# Patient Record
Sex: Female | Born: 1970
Health system: Southern US, Community
[De-identification: ages and names within clinical notes are randomized; demographics above are authoritative.]

## PROBLEM LIST (undated history)

## (undated) DIAGNOSIS — J45909 Unspecified asthma, uncomplicated: Secondary | ICD-10-CM

## (undated) DIAGNOSIS — I10 Essential (primary) hypertension: Secondary | ICD-10-CM

## (undated) DIAGNOSIS — C801 Malignant (primary) neoplasm, unspecified: Secondary | ICD-10-CM

## (undated) HISTORY — PX: COLONOSCOPY: SHX174

## (undated) HISTORY — PX: APPENDECTOMY: SHX54

---

## 1998-05-21 ENCOUNTER — Other Ambulatory Visit: Admission: RE | Admit: 1998-05-21 | Discharge: 1998-05-21 | Payer: Self-pay | Admitting: Obstetrics and Gynecology

## 1999-09-29 ENCOUNTER — Other Ambulatory Visit: Admission: RE | Admit: 1999-09-29 | Discharge: 1999-09-29 | Payer: Self-pay | Admitting: Obstetrics and Gynecology

## 2000-12-28 ENCOUNTER — Other Ambulatory Visit: Admission: RE | Admit: 2000-12-28 | Discharge: 2000-12-28 | Payer: Self-pay | Admitting: Obstetrics and Gynecology

## 2001-06-22 ENCOUNTER — Ambulatory Visit (HOSPITAL_COMMUNITY): Admission: RE | Admit: 2001-06-22 | Discharge: 2001-06-22 | Payer: Self-pay | Admitting: Obstetrics and Gynecology

## 2001-06-22 ENCOUNTER — Encounter: Payer: Self-pay | Admitting: Obstetrics and Gynecology

## 2001-06-28 ENCOUNTER — Encounter: Payer: Self-pay | Admitting: Obstetrics and Gynecology

## 2001-06-28 ENCOUNTER — Ambulatory Visit (HOSPITAL_COMMUNITY): Admission: RE | Admit: 2001-06-28 | Discharge: 2001-06-28 | Payer: Self-pay | Admitting: Obstetrics and Gynecology

## 2002-01-01 ENCOUNTER — Other Ambulatory Visit: Admission: RE | Admit: 2002-01-01 | Discharge: 2002-01-01 | Payer: Self-pay | Admitting: Obstetrics and Gynecology

## 2002-07-09 ENCOUNTER — Encounter: Payer: Self-pay | Admitting: Obstetrics and Gynecology

## 2002-07-09 ENCOUNTER — Encounter: Admission: RE | Admit: 2002-07-09 | Discharge: 2002-07-09 | Payer: Self-pay | Admitting: Obstetrics and Gynecology

## 2002-07-09 HISTORY — PX: BREAST CYST ASPIRATION: SHX578

## 2003-01-01 ENCOUNTER — Other Ambulatory Visit: Admission: RE | Admit: 2003-01-01 | Discharge: 2003-01-01 | Payer: Self-pay | Admitting: Obstetrics and Gynecology

## 2003-04-02 ENCOUNTER — Ambulatory Visit (HOSPITAL_BASED_OUTPATIENT_CLINIC_OR_DEPARTMENT_OTHER): Admission: RE | Admit: 2003-04-02 | Discharge: 2003-04-02 | Payer: Self-pay | Admitting: Plastic Surgery

## 2003-04-02 ENCOUNTER — Ambulatory Visit (HOSPITAL_COMMUNITY): Admission: RE | Admit: 2003-04-02 | Discharge: 2003-04-02 | Payer: Self-pay | Admitting: Plastic Surgery

## 2003-04-02 ENCOUNTER — Encounter (INDEPENDENT_AMBULATORY_CARE_PROVIDER_SITE_OTHER): Payer: Self-pay | Admitting: Specialist

## 2003-06-27 ENCOUNTER — Encounter (INDEPENDENT_AMBULATORY_CARE_PROVIDER_SITE_OTHER): Payer: Self-pay | Admitting: *Deleted

## 2003-06-27 ENCOUNTER — Inpatient Hospital Stay (HOSPITAL_COMMUNITY): Admission: AD | Admit: 2003-06-27 | Discharge: 2003-06-29 | Payer: Self-pay | Admitting: Pediatrics

## 2003-08-01 ENCOUNTER — Other Ambulatory Visit: Admission: RE | Admit: 2003-08-01 | Discharge: 2003-08-01 | Payer: Self-pay | Admitting: Obstetrics and Gynecology

## 2004-08-04 ENCOUNTER — Other Ambulatory Visit: Admission: RE | Admit: 2004-08-04 | Discharge: 2004-08-04 | Payer: Self-pay | Admitting: Obstetrics and Gynecology

## 2005-04-19 ENCOUNTER — Ambulatory Visit (HOSPITAL_COMMUNITY): Admission: RE | Admit: 2005-04-19 | Discharge: 2005-04-19 | Payer: Self-pay | Admitting: Chiropractic Medicine

## 2006-03-02 ENCOUNTER — Emergency Department (HOSPITAL_COMMUNITY): Admission: EM | Admit: 2006-03-02 | Discharge: 2006-03-03 | Payer: Self-pay | Admitting: Emergency Medicine

## 2006-03-03 ENCOUNTER — Ambulatory Visit: Payer: Self-pay | Admitting: Internal Medicine

## 2006-03-07 ENCOUNTER — Ambulatory Visit: Payer: Self-pay | Admitting: Internal Medicine

## 2006-03-07 LAB — CONVERTED CEMR LAB
AST: 81 units/L — ABNORMAL HIGH (ref 0–37)
Bilirubin, Direct: 1.1 mg/dL — ABNORMAL HIGH (ref 0.0–0.3)
IgG (Immunoglobin G), Serum: 1695 mg/dL — ABNORMAL HIGH (ref 694–1618)
Total Bilirubin: 2.3 mg/dL — ABNORMAL HIGH (ref 0.3–1.2)

## 2006-03-08 ENCOUNTER — Ambulatory Visit: Payer: Self-pay | Admitting: Internal Medicine

## 2006-03-22 ENCOUNTER — Ambulatory Visit: Payer: Self-pay | Admitting: Internal Medicine

## 2006-03-22 LAB — CONVERTED CEMR LAB
Bilirubin, Direct: 0.3 mg/dL (ref 0.0–0.3)
Total Protein: 7.4 g/dL (ref 6.0–8.3)

## 2006-03-24 ENCOUNTER — Ambulatory Visit: Payer: Self-pay | Admitting: Internal Medicine

## 2006-04-18 ENCOUNTER — Encounter: Admission: RE | Admit: 2006-04-18 | Discharge: 2006-04-18 | Payer: Self-pay | Admitting: Internal Medicine

## 2006-04-22 ENCOUNTER — Ambulatory Visit: Payer: Self-pay | Admitting: Internal Medicine

## 2006-04-22 LAB — CONVERTED CEMR LAB
ALT: 14 units/L (ref 0–40)
Alkaline Phosphatase: 74 units/L (ref 39–117)

## 2006-10-06 ENCOUNTER — Ambulatory Visit: Payer: Self-pay | Admitting: Internal Medicine

## 2006-10-06 LAB — CONVERTED CEMR LAB
ALT: 10 units/L (ref 0–40)
AST: 15 units/L (ref 0–37)
Bilirubin, Direct: 0.1 mg/dL (ref 0.0–0.3)
Total Protein: 7.4 g/dL (ref 6.0–8.3)

## 2006-10-20 ENCOUNTER — Ambulatory Visit: Payer: Self-pay | Admitting: Internal Medicine

## 2006-10-20 LAB — CONVERTED CEMR LAB: Creatinine, Ser: 0.6 mg/dL (ref 0.4–1.2)

## 2006-10-26 ENCOUNTER — Encounter: Admission: RE | Admit: 2006-10-26 | Discharge: 2006-10-26 | Payer: Self-pay | Admitting: Internal Medicine

## 2007-05-11 DIAGNOSIS — C801 Malignant (primary) neoplasm, unspecified: Secondary | ICD-10-CM

## 2007-05-11 HISTORY — PX: MELANOMA EXCISION: SHX5266

## 2007-05-11 HISTORY — DX: Malignant (primary) neoplasm, unspecified: C80.1

## 2007-09-25 ENCOUNTER — Inpatient Hospital Stay (HOSPITAL_COMMUNITY): Admission: AD | Admit: 2007-09-25 | Discharge: 2007-09-25 | Payer: Self-pay | Admitting: Obstetrics and Gynecology

## 2007-10-14 DIAGNOSIS — J309 Allergic rhinitis, unspecified: Secondary | ICD-10-CM | POA: Insufficient documentation

## 2007-10-14 DIAGNOSIS — Z862 Personal history of diseases of the blood and blood-forming organs and certain disorders involving the immune mechanism: Secondary | ICD-10-CM

## 2007-10-14 DIAGNOSIS — Z8639 Personal history of other endocrine, nutritional and metabolic disease: Secondary | ICD-10-CM

## 2007-10-20 ENCOUNTER — Inpatient Hospital Stay (HOSPITAL_COMMUNITY): Admission: AD | Admit: 2007-10-20 | Discharge: 2007-10-21 | Payer: Self-pay | Admitting: Obstetrics and Gynecology

## 2007-10-27 ENCOUNTER — Inpatient Hospital Stay (HOSPITAL_COMMUNITY): Admission: RE | Admit: 2007-10-27 | Discharge: 2007-10-29 | Payer: Self-pay | Admitting: Obstetrics and Gynecology

## 2007-12-04 ENCOUNTER — Emergency Department (HOSPITAL_COMMUNITY): Admission: EM | Admit: 2007-12-04 | Discharge: 2007-12-04 | Payer: Self-pay | Admitting: Emergency Medicine

## 2007-12-07 ENCOUNTER — Ambulatory Visit (HOSPITAL_COMMUNITY): Admission: RE | Admit: 2007-12-07 | Discharge: 2007-12-07 | Payer: Self-pay | Admitting: Urology

## 2009-06-04 IMAGING — CR DG ABDOMEN 1V
1 series · 1 of 1 positions shown · non-contrast
Comparison: CT abdomen and pelvis 12/04/2007.

CLINICAL DATA: Right UPJ stone.

ABDOMEN - 1 VIEW

[t abdomen supine]
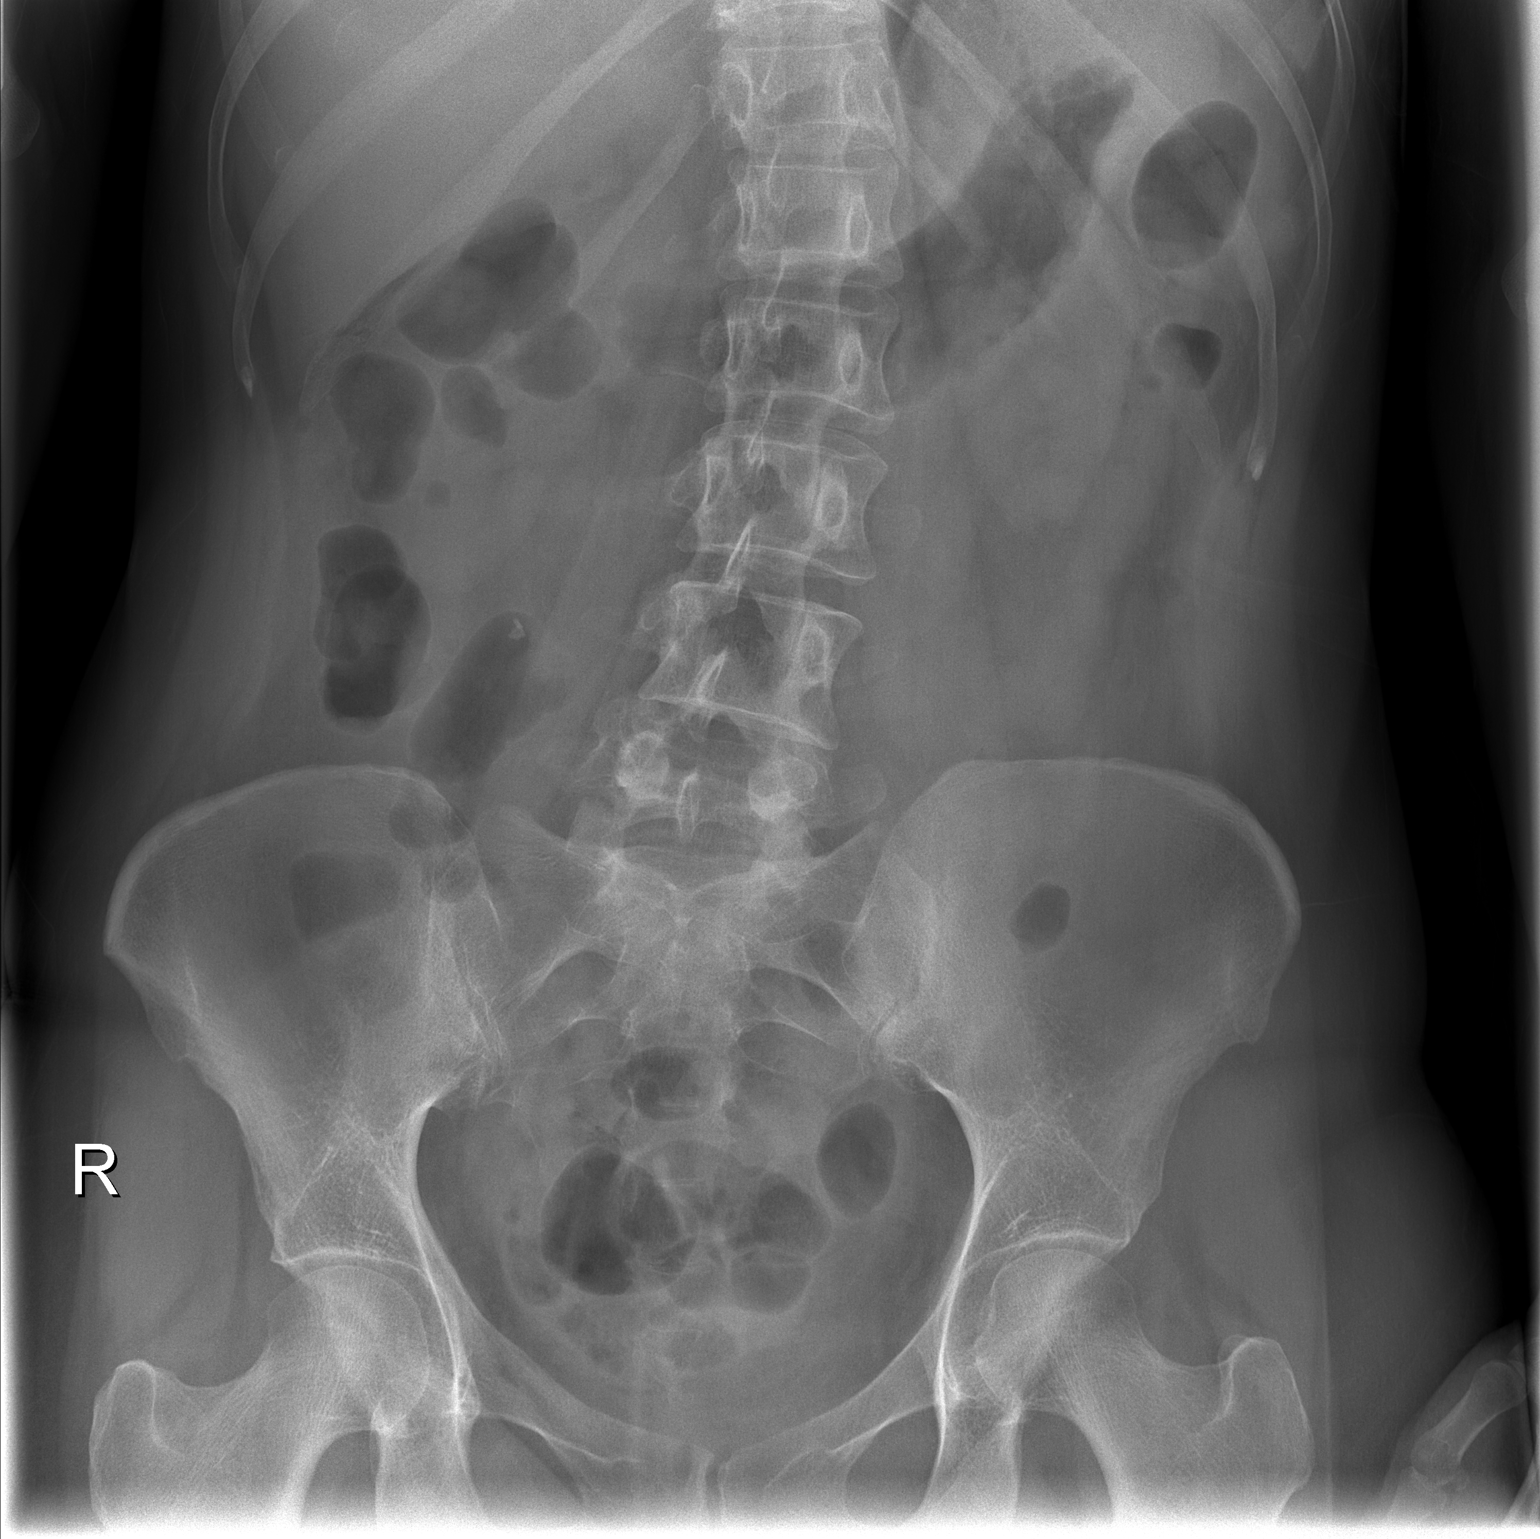

[1 of 1 positions shown; findings below may reference images not displayed]

FINDINGS: Calcification is seen projecting over expected course the
right ureter at the level of the L4 transverse process.  No other
evidence of urinary tract calculi.  Convex left scoliosis noted.
IMPRESSION: Right ureteral stone is at the level of the right L4 transverse
process.

## 2010-09-22 NOTE — Assessment & Plan Note (Signed)
[facility]                         GASTROENTEROLOGY OFFICE NOTE   NAME:[name], [name]                       MRN:          [id number]  DATE:10/06/2006                            DOB:          October 19, 1970    CHIEF COMPLAINT:  Abdominal pain, followup of hepatitis.   PROBLEMS:  1. Hepatitis syndrome with jaundice and pruritus last year, resolved.      Etiology was not clear despite extensive serologic workup.  2. Questionable prominent head of the pancreas on ultrasound and CT,      normal on MRI.  3. Two tiny (16 mm and 11 mm) rapidly enhancing lesions in the right      lobe of the liver on MRI thought to be focal nodular hyperplasia.  4. Chronic intermittent headaches.  5. Status post appendectomy.   HISTORY:  [name] comes in concerned because she has a persistent right  upper quadrant cramp that has really been there even when she was sick  before.  She is also concerned because a friend of hers developed a  problem with 30 lesions in her liver that they think is cancer and has  not been yet diagnosed.  She thinks she is still jaundiced, though her  husband does not think so.  She is on no medications at this time.  She  has not had fevers.  The pain is not really bothersome, it is just sort  of there.   PHYSICAL EXAMINATION:  GENERAL:  Well-developed, well-nourished white  woman in no acute distress.  VITAL SIGNS:  Weight 129 pounds, pulse 76, blood pressure 110/74.  ABDOMEN:  Soft.  Sometimes when I press deeply in the right upper  quadrant, there is a small area in the right mid upper quadrant that  seems tender to deep palpation.  There is no muscle tenderness.  There  is no rib tenderness.  There is no hepatosplenomegaly or mass.  There is  no stigmata of chronic liver disease.  HEENT:  The eyes are anicteric.   ASSESSMENT:  Previous hepatitis.  There is some chronic right-sided  discomfort of unclear etiology.  She has these tiny liver  lesions of  unclear etiology but are thought to represent very small areas of focal  nodular hyperplasia.  She remains concerned about the lack of diagnosis  from her previous hepatitis, i.e., no clear etiology.   PLAN:  1. Reassurance.  2. Recheck LFTs today.  Certainly if they are up, I would re-image.  3. At some point in the next 6 months, I think she would like to      consider a repeat MRI.  That is reasonable.  I think she may need      this for reassurance.  We will discuss pending the LFTs.     Iva Boop, MD,FACG  Electronically Signed    CEG/MedQ  DD: 10/06/2006  DT: 10/06/2006  Job #: [id number]

## 2010-09-25 NOTE — Assessment & Plan Note (Signed)
[facility]                           GASTROENTEROLOGY OFFICE NOTE   NAME:LIESKEXcaret, Morad                       MRN:          [id number]  DATE:03/08/2006                            DOB:          12/16/1970    ADDENDUM   I reviewed the CT and ultrasound scanning with radiologist regarding the  prominent head of pancreas. That is what she has is a prominent head of  pancreas without any mass or any signs of pancreatitis. This is probably  just a variation in her anatomy and not anything significant. However, given  the changes we may need to repeat imaging with something like an MRI (to  minimize radiation exposure) in three months. I have explained this to the  patient over the phone and we will follow up with that as planned.     Iva Boop, MD,FACG  Electronically Signed    CEG/MedQ  DD: 03/08/2006  DT: 03/08/2006  Job #: [id number]

## 2010-09-25 NOTE — Assessment & Plan Note (Signed)
[facility]                           GASTROENTEROLOGY OFFICE NOTE   NAME:[name], Roulhac                       MRN:          [id number]  DATE:03/08/2006                            DOB:          10-Dec-1970    CHIEF COMPLAINT:  Followup of jaundice, hepatitis problems.   Ms. Adelsberger returns.  Her CMV studies, acute hepatitis panel, and monospot  were all negative.  I spoke to her yesterday.  Her jaundice is improving  though she is still having itching.  Her energy level was better.  Followup  LFT show a bilirubin now 2.3, down from 6.7, alkaline phosphatase 199 down  from 246, AST stable at 81, ALT down to 169 from 323.  Her albumin is 3.3.  Total protein 7.8.  We ordered EBV titers.  We did get some immunoglobulins  back with a slightly elevated immunoglobulin G, normal IgM level.  I have  gone over numerous questions with her on the phone and then again today.  She has been on birth control in the past, but not now.  I recommended she  hold her birth control at this time and stop her Zyrtec for itching, and we  are going to try some naltrexone 50 mg as needed.   Overall, she is much improved at this point and based upon my clinical  suspicions, I think she has had some sort of a viral syndrome that caused  the hepatitis and jaundice that seems to be improving.  She understands that  I cannot tell her the exact etiology, but we have ruled out several causes  at this point.   PLAN:  1. Followup LFTs and check hepatitis A IgG level in 2 weeks with a      followup shortly there after.  2. She has a prominence of the head to the pancreas on ultrasound and CT      scanning as part of this workup.  I suspect that is not clinically      relevant.  Her amylase and lipase were normal.  I will review those      films and let her know about that.  We may need to follow that up with      imaging in the future, but it is not clear at this point.     Iva Boop, MD,FACG  Electronically Signed    CEG/MedQ  DD: 03/08/2006  DT: 03/08/2006  Job #: [id number]

## 2010-09-25 NOTE — Assessment & Plan Note (Signed)
[facility]                           GASTROENTEROLOGY OFFICE NOTE   NAME:LIESKEKamia, [name]                       MRN:          [id number]  DATE:03/24/2006                            DOB:          January 18, 1971    CHIEF COMPLAINT:  Follow up of hepatitis.   [name]'s hepatic function panel is normal with the exception of a total  bilirubin of 1.3 with a direct bilirubin of 0.3.  She feels well, she has  not had to use any more naltrexone.  The EBV studies showed evidence of old  but not new infection.  Her Hepatitis A Total Antibody was normal.  She is  feeling better except for some vague discomfort in the right flank area at  the bottom of her ribs.  Otherwise feeling well.   PHYSICAL EXAMINATION:  Weight 129 pounds, pulse 80, blood pressure 110/62.  ABDOMEN:  Soft, nontender, no organomegaly or mass.  There is perhaps some  mild tenderness in the right lateral costal margin.   Note, she said she had fever blisters before all of this started and was  asking if herpes could have caused her hepatitis.   ASSESSMENT:  1. Hepatitis, which sounds like it was viral in origin.  It could have      been herpes simplex virus, though I think people usually sicker and      have a more protracted course.  At any rate, I think her hepatitis is      resolved and probably permanently so given the workup she has had.  2. Prominence of the head of the pancreas.  Probably nothing, she is quite      concerned about this.  I looked at the CT and ultrasound with one of      the radiologists.  It was thought that is probably a normal variant but      that the head was larger than typical.  Perhaps this is related to      whatever virus I thought she had.   PLAN:  1. Supportive care.  2. She could consider getting a Hepatitis A vaccine.  3. Will plan to follow up her LFTs in a month to 6 weeks.  4. Prior to that, in early December, will go ahead with an MRI of the  pancreas to make sure there is no mass there.  Otherwise, she is      reassured.  She is claustrophobic, so will need to do an open MRI I      think.     Iva Boop, MD,FACG  Electronically Signed    CEG/MedQ  DD: 03/24/2006  DT: 03/24/2006  Job #: [id number]

## 2010-09-25 NOTE — Op Note (Signed)
NAMEJANYTH, [name]                          ACCOUNT NO.:  [id number]   MEDICAL RECORD NO.:  [id number]                   PATIENT TYPE:  MAT   LOCATION:  [facility]                                 [facility]ILITY:  [facility]   PHYSICIAN:  Brantley Persons, M.D.             DATE OF BIRTH:  10/06/1971   DATE OF PROCEDURE:  04/02/2003  DATE OF DISCHARGE:                                 OPERATIVE REPORT   PREOPERATIVE DIAGNOSIS:  1. Suspicious skin lesion, right sternal area.  2. Suspicious skin lesion, right upper breast.  3. Suspicious skin lesion, right abdomen.  4. Suspicious skin lesion, mid lower paraspinal back area.   POSTOPERATIVE DIAGNOSIS:  1. Suspicious skin lesion, right sternal area.  2. Suspicious skin lesion, right upper breast.  3. Suspicious skin lesion, right abdomen.  4. Suspicious skin lesion, mid lower paraspinal back area.   OPERATION PERFORMED:  1. Excision of a 2.0 cm right sternal suspicious recurrent skin lesion.  2. Complex closure of 2.2 cm right sternal incision.  3. Excision of 0.5 cm right breast suspicious skin lesion.  4. Intermediate closure of a 0.7 cm right breast incision.  5. Excision of 0.6 cm right abdominal suspicious skin lesion.  6. Excision of 0.7 cm suspicious mid lower back skin lesion.   SURGEON:  Brantley Persons, M.D.   ANESTHESIA:  1% lidocaine with epinephrine.   COMPLICATIONS:  None.   INDICATIONS FOR PROCEDURE:  The patient is a 40 year old Caucasian female  who was referred by Dr. Campbell Stall for evaluation for excision of several  suspicious skin lesions.  In particular, there is one on the right on the  right sternal area that has recurred.  Additionally, she has another one on  the right upper breast, the right abdomen and the mid lower paraspinal back  area.  The patient presents as she would like to have these moles removed.  The patient is currently pregnant and so I gave her Keflex perioperatively.  She has also checked with her  ob physician and he feels that it should not  be a problem to do this surgery as well as to take the Keflex.   DESCRIPTION OF PROCEDURE:  The patient was brought to the minor surgery room  and placed on the table in the supine position.  Appropriate supports were  given to her knees, her back and head, especially in consideration of her  pregnancy.  The skin and subcutaneous tissue in the area of  the right  sternal, right upper breast and right abdominal areas were then prepped with  Betadine and draped in sterile fashion.  The skin and subcutaneous tissues  were then injected with 1% lidocaine with epinephrine in each of these  areas.  The patient tolerated the procedure overall well.  Skin and  subcutaneous tissues in the areas of the right sternal, right upper breast  and right abdominal suspicious skin lesions were injected  with 1% lidocaine  with epinephrine.  After adequate hemostasis and anesthesia had taken  effect, the procedure was begun.  First the right abdominal skin lesion was  excised.  This was excised full thickness through the skin into subcutaneous  tissues.  The specimen was marked in the 12 o'clock position and passed off  the table to undergo permanent pathologic section evaluation.  The skin  edges were then undermined for easier closure.  The wound was closed using 3-  0 Monocryl in the dermal layer followed by a 3-0 Monocryl running  intracuticular stitch on the skin.  The right upper breast skin lesion was  then sharply excised.  The specimen was marked at the 12 o'clock position  with a suture and passed off the table to undergo permanent pathologic  section evaluation.  The skin edges were then undermined for easier closure.  The deeper subcutaneous tissues were closed using 3-0 Monocryl suture.  The  dermal layer was also closed with a 3-0 Monocryl suture followed by a 3-0  Monocryl running intracuticular stitch on the skin.  Next, the right sternal  skin  lesion was excised.  This also was excised full thickness through the  skin into the surrounding subcutaneous tissues.  The specimen was marked at  the 12 o'clock position with a suture and then passed off the table to  undergo permanent pathologic section evaluation.  I did have the patient sit  up during this part of it so that we could get her skin tension lines  properly across her upper breast.  She was then placed back down into the  recumbent position.  The skin edges were undermined for easier closure.  The  deep subcutaneous tissues were closed using 3-0 Monocryl suture.  The dermal  layer was also closed with a 3-0 Monocryl suture.  The skin was also closed  with a 3-0 Prolene running intracuticular suture.  All three of these areas  of closed incisions were then dressed with Steri-Strips.  The patient was  then placed on her side with her right hip up.  The area of her skin that  had the skin lesion in the mid lower back area was then prepped with  Betadine and draped in sterile fashion.  The skin and subcutaneous tissues  in the area of this skin were injected with 1% lidocaine with epinephrine.  After adequate hemostasis and anesthesia had taken effect, the procedure was  begun.  Using loupe magnification, the borders of the skin lesion were  examined.  At least 1 mm margins were marked circumferentially around the  skin lesion at that time.  The suspicious skin lesion was excised full  thickness through the skin into the subcutaneous fatty tissue layers.  The  specimen was marked at the 12 o'clock position with a suture and passed off  the table to undergo permanent pathologic section evaluation.  The incision  was then closed using 3-0 Monocryl in the deep as well as superficial dermal  layers and a 3-0 Monocryl running intracuticular stitch.  This incision was dressed with benzoin and Steri-Strips.  There were no complications.  The  patient tolerated the procedure well. At  the end of the case, she was  ambulated and discharged home in stable condition.  Follow-up appointment  will be tomorrow in the office.  Brantley Persons, M.D.    MC/MEDQ  D:  04/02/2003  T:  04/03/2003  Job:  [id number]

## 2010-09-25 NOTE — Assessment & Plan Note (Signed)
[facility]                           GASTROENTEROLOGY OFFICE NOTE   [name], [name]                       MRN:          [id number]  DATE:03/03/2006                            DOB:          May 13, 1970    REFERRING PHYSICIAN:  Trudi Ida. Denton Lank, M.D.   REASON FOR CONSULTATION:  Jaundice, hepatitis.   ASSESSMENT:  A 40 year old white woman with a several day history of  jaundice and a viral syndrome.  This is suggestive of some sort of viral  hepatitis. Her Monospot is negative.  It is not really consistent with  hepatitis A or B, thought those are possible.  Testing for those is pending.   PLAN:  1. Await acute hepatitis panel, CMV serologies, ANA titer, those pending.  2. Supportive care at this point.  She has mild pruritus and otherwise      mild to moderate symptoms and is tolerating life as an outpatient.  Her      INR is normal.  3. Once results are in, we will notify the patient.  4. We will need repeat lab testing timing to be determined upon clinical      course.   HISTORY:  A 40 year old white woman that was in her usual state of health  without problems.  Saturday night she developed some left sided sinus  congestion relatively severe and sudden, felt achy.  That was six days ago.  The next day she had rash, problems with a macular rash over her body, low  grade fever, noticed orange-colored urine the following day, aches,  headache, low-grade fever as well.  Sort of a stuttering pattern where she  felt a little better at one point but then yesterday felt even better but  then today feels a little bit worse.  She has been to [facility] and the  emergency room.  On March 01, 2006, bilirubin 5.1, direct 3.92, alk phos 238, AST 189, ALT  496.  [place], [facility] Spotted Fever titers are negative.  She went to  the ER because of this and her bilirubin was 6.7 yesterday, alk phos 246,  AST 81, ALT 323.  CBC was normal.  She did have  a left shift.  No atypical  lymphocytes noted.  They called me and I recommended a Monospot, that was  negative.  Urinalysis shows bile or bilirubin, 3-6 white cells.  An  ultrasound was performed that showed a prominent pancreatic head and a 7-to-  8-mm common bile duct with no gallstones or gallbladder abnormality.  A CT  was performed which showed similar findings with a prominent pancreatic head  but no biliary ductal dilation or other abnormalities.  Her lipase was  normal.  Amylase normal as well.  At this point she feels somewhat tired and sore in her abdomen.  Her rash is  fading.  She had a rash on her trunk and her wrists and her lower  extremities.  It is now only on the lower extremities.  She does not think  she is still febrile.   MEDICATIONS:  Zyrtec, Goody Powder as needed.  DRUG ALLERGIES:  NONE KNOWN.   Note, she has not been on antibiotics recently.   FAMILY HISTORY:  No liver disease reported, though her mother has had  gallstones and has had post cholecystectomy pain; she also had gallstone  pancreatitis.   PAST MEDICAL HISTORY:  1. Chronic intermittent headaches, usually related to menstrual cycle.  2. Allergic rhinosinusitis.  3. Appendectomy.   SOCIAL HISTORY:  She is married.  She has one child.  She works in [employer].  She has a glass of wine once a month at most.  No tobacco or  drugs.   REVIEW OF SYSTEMS:  See above.  Note, she has had muscle aches and joint  pain with this.   PHYSICAL EXAMINATION:  GENERAL:  Reveals a well-developed, well-nourished,  young to middle-aged white woman.  VITAL SIGNS:  Weight 127.8 pounds, pulse 84, blood pressure 100/72.  EYES:  Show sclerae icterus.  ORAL CAVITY:  Mouth free of lesions.  The palate is icteric.  NECK:  Supple.  No thyromegaly or mass.  CHEST:  Clear.  HEART:  S1 S2.  No rubs or gallops.  ABDOMEN:  Soft, nontender.  No organomegaly, mass.  LYMPHATIC:  No neck or supraclavicular nodes.   SKIN:  Jaundiced.  There is a macular, slightly raised rash with 2-to-3-mm  lesions in the pretibial area on the lower extremities.  It is red.  No real  rash otherwise.  No stigmata of chronic liver disease.  NEUROLOGIC/PSYCHIATRIC:  She is alert and oriented x3.   I have reviewed the ER notes, the CT and ultrasound reports, laboratory  studies.  Her potassium was slightly low at 3.2.  This has not been  supplemented yet.  We can follow up on that.     Iva Boop, MD,FACG    CEG/MedQ  DD: 03/03/2006  DT: 03/04/2006  Job #: [id number]   cc:   Trudi Ida. Denton Lank, M.D.

## 2010-09-25 NOTE — H&P (Signed)
[name], [name]                          ACCOUNT NO.:  [id number]   MEDICAL RECORD NO.:  [id number]                   PATIENT TYPE:  INP   LOCATION:  9169                                 [facility]ACILITY:  [facility]   PHYSICIAN:  Guy Sandifer. Arleta Creek, M.D.           DATE OF BIRTH:  10/06/1971   DATE OF ADMISSION:  06/27/2003  DATE OF DISCHARGE:                                HISTORY & PHYSICAL   CHIEF COMPLAINT:  Ruptured membranes.   HISTORY OF PRESENT ILLNESS:  This patient is a 40 year old married white  female Gravida II, Para 0 with an EDC of July 22, 2003 confirmed by  ultrasound placing her at 75 and 3/7 weeks.  She began leaking fluid enough  to make her underwear wet at approximately 6 a.m. this morning.  She has  leaked intermittently throughout the day.  While being examined in the  office she had fluid leaking onto the floor.  She has had a few  contractions.  She has had some spotting over the last couple of days after  an examination on February 15, but no heavy bleeding.  No central nervous  system changes.  No epigastric pain.  No fever.  No nausea or vomiting.   PAST MEDICAL HISTORY:  Per prenatal history and physical.   PAST SURGICAL HISTORY:  Per prenatal history and physical.   FAMILY HISTORY:  Per prenatal history and physical.   OBSTETRIC HISTORY:  Per prenatal history and physical.   MEDICATIONS:  1. Prenatal vitamins.   ALLERGIES:  No known drug allergies.   SOCIAL HISTORY:  The patient denies tobacco, alcohol or drug abuse.   PHYSICAL EXAMINATION:  VITAL SIGNS:  Height 5'8, weight 162 pounds, blood  pressure 132/90.  HEENT:  Without periorbital edema.  LUNGS:  Clear to auscultation.  HEART:  Regular rate and rhythm.  BREASTS:  Not examined.  ABDOMEN:  Gravid and non-tender.  No epigastric tenderness.  EXTREMITIES:  1+ edema.  Deep tendon reflexes are 2+ and equal without  clonus.  PELVIC EXAM:  Gross rupture of membranes spilling onto the floor,  nitrazine  positive.  Cervix is 2 cm dilated, 70% effaced and -2 station and vertex.   LABORATORY DATA:  Group B Beta Strep unknown.  Blood type A positive. Rh  antibody screen negative.  RPR non-reactive.  Rubella immune.  Hepatitis B  surface antigen negative. HIV declined.  Gonorrhea and chlamydia negative.   ASSESSMENT:  1. Intrauterine pregnancy at 36 and 3/7 weeks.  2. Gross rupture of membranes.  3. Unknown Group B Beta Strep status.   PLAN:  1. Admit to labor and delivery.  2. We will repeat blood pressures and obtain PH laboratories.  3. We will do Group B Beta Strep culture.  Meanwhile we will treat on Group     B Beta Strep protocol.  Guy Sandifer Arleta Creek, M.D.    JET/MEDQ  D:  06/27/2003  T:  06/27/2003  Job:  [id number]

## 2010-09-25 NOTE — Assessment & Plan Note (Signed)
[facility]                           GASTROENTEROLOGY OFFICE NOTE   ROMI, RATHEL                       MRN:          [id number]  DATE:03/12/2006                            DOB:          05-11-1970    REASON:  Ms. Helvey called concerned about some right upper quadrant pain.  She has minimal crampy discomfort just below the ribs on the right side.  She has apparently been diagnosed with acute hepatitis.  She claims that her  liver tests have been improving.  She is without fever or nausea and  vomiting.   I explained to Ms. Cortez that the symptoms are not in of itself alarming.  This could be due to some mild capsular distention of the liver.  No further  action was taken.  She was instructed to contact the office if she develops  fever, chills or worsening abdominal pain.     Barbette Hair. Arlyce Dice, MD,FACG  Electronically Signed    RDK/MedQ  DD: 03/12/2006  DT: 03/13/2006  Job #: [id number]   cc:   Iva Boop, MD,FACG

## 2011-02-04 LAB — CCBB MATERNAL DONOR DRAW

## 2011-02-04 LAB — URINALYSIS, ROUTINE W REFLEX MICROSCOPIC
Bilirubin Urine: NEGATIVE
Glucose, UA: NEGATIVE
Ketones, ur: NEGATIVE
pH: 6

## 2011-02-04 LAB — URIC ACID: Uric Acid, Serum: 4.3

## 2011-02-04 LAB — CBC
HCT: 43.1
Hemoglobin: 13.7
Hemoglobin: 13.2
MCHC: 33
Platelets: 156
Platelets: 186
RBC: 4.51
RDW: 14.6
RDW: 15
RDW: 15.1
WBC: 13.5 — ABNORMAL HIGH

## 2011-02-04 LAB — COMPREHENSIVE METABOLIC PANEL
Alkaline Phosphatase: 132 — ABNORMAL HIGH
BUN: 7
CO2: 23
GFR calc non Af Amer: >60
Glucose, Bld: 95
Potassium: 3.6
Total Bilirubin: 0.6
Total Protein: 6.2

## 2011-02-04 LAB — LACTATE DEHYDROGENASE: LDH: 141

## 2011-02-04 LAB — RPR: RPR Ser Ql: NONREACTIVE

## 2011-02-05 LAB — BASIC METABOLIC PANEL
BUN: 11
Chloride: 106
GFR calc non Af Amer: >60
Glucose, Bld: 87
Potassium: 3.9
Sodium: 142

## 2011-02-05 LAB — CBC
HCT: 41.4
Hemoglobin: 14
MCV: 95.5
Platelets: 240
WBC: 6.1

## 2011-02-05 LAB — URINALYSIS, ROUTINE W REFLEX MICROSCOPIC
Ketones, ur: NEGATIVE
Protein, ur: NEGATIVE
Urobilinogen, UA: 0.2

## 2011-02-05 LAB — URINE MICROSCOPIC-ADD ON

## 2011-02-05 LAB — POCT PREGNANCY, URINE: Preg Test, Ur: NEGATIVE

## 2014-05-10 HISTORY — PX: OTHER SURGICAL HISTORY: SHX169

## 2014-06-13 ENCOUNTER — Other Ambulatory Visit: Payer: Self-pay | Admitting: Obstetrics and Gynecology

## 2015-09-21 ENCOUNTER — Encounter (HOSPITAL_BASED_OUTPATIENT_CLINIC_OR_DEPARTMENT_OTHER): Payer: Self-pay | Admitting: *Deleted

## 2015-09-21 ENCOUNTER — Emergency Department (HOSPITAL_BASED_OUTPATIENT_CLINIC_OR_DEPARTMENT_OTHER)
Admission: EM | Admit: 2015-09-21 | Discharge: 2015-09-21 | Disposition: A | Discharge status: Home / Self Care | Payer: BLUE CROSS/BLUE SHIELD | Attending: Emergency Medicine | Admitting: Emergency Medicine

## 2015-09-21 ENCOUNTER — Emergency Department (HOSPITAL_BASED_OUTPATIENT_CLINIC_OR_DEPARTMENT_OTHER): Payer: BLUE CROSS/BLUE SHIELD

## 2015-09-21 DIAGNOSIS — Z0131 Encounter for examination of blood pressure with abnormal findings: Secondary | ICD-10-CM | POA: Diagnosis not present

## 2015-09-21 DIAGNOSIS — R0600 Dyspnea, unspecified: Secondary | ICD-10-CM

## 2015-09-21 DIAGNOSIS — J45909 Unspecified asthma, uncomplicated: Secondary | ICD-10-CM | POA: Insufficient documentation

## 2015-09-21 DIAGNOSIS — Z859 Personal history of malignant neoplasm, unspecified: Secondary | ICD-10-CM | POA: Insufficient documentation

## 2015-09-21 DIAGNOSIS — Z79899 Other long term (current) drug therapy: Secondary | ICD-10-CM | POA: Insufficient documentation

## 2015-09-21 DIAGNOSIS — I10 Essential (primary) hypertension: Secondary | ICD-10-CM | POA: Insufficient documentation

## 2015-09-21 DIAGNOSIS — Z0130 Encounter for examination of blood pressure without abnormal findings: Secondary | ICD-10-CM

## 2015-09-21 DIAGNOSIS — R0602 Shortness of breath: Secondary | ICD-10-CM | POA: Insufficient documentation

## 2015-09-21 HISTORY — DX: Essential (primary) hypertension: I10

## 2015-09-21 HISTORY — DX: Unspecified asthma, uncomplicated: J45.909

## 2015-09-21 HISTORY — DX: Malignant (primary) neoplasm, unspecified: C80.1

## 2015-09-21 LAB — CBC WITH DIFFERENTIAL/PLATELET
BASOS PCT: 0 %
Basophils Absolute: 0 10*3/uL (ref 0.0–0.1)
EOS ABS: 0 10*3/uL (ref 0.0–0.7)
Eosinophils Relative: 1 %
HEMATOCRIT: 41.4 % (ref 36.0–46.0)
HEMOGLOBIN: 13.9 g/dL (ref 12.0–15.0)
LYMPHS ABS: 1 10*3/uL (ref 0.7–4.0)
Lymphocytes Relative: 18 %
MCH: 32 pg (ref 26.0–34.0)
MCHC: 33.6 g/dL (ref 30.0–36.0)
MCV: 95.4 fL (ref 78.0–100.0)
MONO ABS: 0.3 10*3/uL (ref 0.1–1.0)
MONOS PCT: 6 %
NEUTROS PCT: 75 %
Neutro Abs: 4.5 10*3/uL (ref 1.7–7.7)
Platelets: 261 10*3/uL (ref 150–400)
RBC: 4.34 MIL/uL (ref 3.87–5.11)
RDW: 12.4 % (ref 11.5–15.5)
WBC: 5.9 10*3/uL (ref 4.0–10.5)

## 2015-09-21 LAB — BASIC METABOLIC PANEL
Anion gap: 7 (ref 5–15)
BUN: 10 mg/dL (ref 6–20)
CALCIUM: 9.6 mg/dL (ref 8.9–10.3)
CHLORIDE: 105 mmol/L (ref 101–111)
CO2: 28 mmol/L (ref 22–32)
CREATININE: 0.65 mg/dL (ref 0.44–1.00)
GFR calc Af Amer: >60 mL/min (ref >60)
GLUCOSE: 91 mg/dL (ref 65–99)
Potassium: 4 mmol/L (ref 3.5–5.1)
SODIUM: 140 mmol/L (ref 135–145)

## 2015-09-21 LAB — URINALYSIS, ROUTINE W REFLEX MICROSCOPIC
BILIRUBIN URINE: NEGATIVE
Glucose, UA: NEGATIVE mg/dL
Hgb urine dipstick: NEGATIVE
KETONES UR: NEGATIVE mg/dL
LEUKOCYTES UA: NEGATIVE
NITRITE: NEGATIVE
Protein, ur: NEGATIVE mg/dL
Specific Gravity, Urine: 1.011 (ref 1.005–1.030)
pH: 6.5 (ref 5.0–8.0)

## 2015-09-21 LAB — TROPONIN I

## 2015-09-21 LAB — PREGNANCY, URINE: Preg Test, Ur: NEGATIVE

## 2015-09-21 NOTE — ED Provider Notes (Signed)
CSN: [id number]     Arrival date & time 09/21/15  1026 History   First MD Initiated Contact with Patient 09/21/15 1041     Chief Complaint  Patient presents with  . Hypertension  . Shortness of Breath     (Consider location/radiation/quality/duration/timing/severity/associated sxs/prior Treatment) HPI Comments: Patient with multiple complaints. States she's not been feeling well for the past week and feeling fatigued. Has had a lot of stress at work. She did measure her blood pressure has been elevated 140 over 90s on a wrist cuff. States she was told she might be blood pressure medication several years ago but did not start it. She endorses feeling anxious and some shortness of breath and chest tightness. She also feels lightheaded and dizzy at times. She had a headache that lasted 2 days 2 days ago. It was gradual onset and diffuse associated with photophobia. No nausea or vomiting. No fever. No headache currently. No weakness, numbness or tingling. No bowel or bladder incontinence. No chronic medical conditions or medications. She does not have regular PCP. She was told she might need blood pressure medication but wanted to try non medical therapy first. She denies feeling dizzy or short of breath currently. She is not in any distress.  Patient is a 45 y.o. female presenting with shortness of breath. The history is provided by the patient and a relative.  Shortness of Breath Associated symptoms: headaches   Associated symptoms: no abdominal pain, no chest pain, no fever and no vomiting     Past Medical History  Diagnosis Date  . Hypertension   . Asthma   . Cancer [facility]) 2009    melanoma   Past Surgical History  Procedure Laterality Date  . Melanoma excision  2009  . Ablation  2016    uterine  . Appendectomy     No family history on file. Social History  Substance Use Topics  . Smoking status: Never Smoker   . Smokeless tobacco: Never Used  . Alcohol Use: None   OB History     No data available     Review of Systems  Constitutional: Negative for fever, chills, activity change and appetite change.  HENT: Negative for congestion and rhinorrhea.   Eyes: Positive for photophobia.  Respiratory: Positive for chest tightness and shortness of breath.   Cardiovascular: Negative for chest pain and palpitations.  Gastrointestinal: Negative for nausea, vomiting and abdominal pain.  Genitourinary: Negative for dysuria, hematuria, vaginal bleeding and vaginal discharge.  Musculoskeletal: Negative for myalgias and arthralgias.  Skin: Negative for wound.  Neurological: Positive for dizziness, weakness, light-headedness and headaches. Negative for numbness.  A complete 10 system review of systems was obtained and all systems are negative except as noted in the HPI and PMH.      Allergies  Zithromax  Home Medications   Prior to Admission medications   Medication Sig Start Date End Date Taking? Authorizing Provider  cetirizine (ZYRTEC) 5 MG tablet Take 5 mg by mouth daily as needed for allergies.   Yes Historical Provider, MD   BP 158/97 mmHg  Pulse 90  Temp(Src) 97.8 F (36.6 C) (Oral)  Resp 20  Ht 5' 7.5" (1.715 m)  Wt 140 lb (63.504 kg)  BMI 21.59 kg/m2  SpO2 100% Physical Exam  Constitutional: She is oriented to person, place, and time. She appears well-developed and well-nourished. No distress.  Slightly anxious  HENT:  Head: Normocephalic and atraumatic.  Mouth/Throat: Oropharynx is clear and moist. No oropharyngeal exudate.  Eyes: Conjunctivae and EOM are normal. Pupils are equal, round, and reactive to light.  Neck: Normal range of motion. Neck supple.  No meningismus.  Cardiovascular: Normal rate, regular rhythm, normal heart sounds and intact distal pulses.   No murmur heard. Pulmonary/Chest: Effort normal and breath sounds normal. No respiratory distress. She exhibits no tenderness.  Abdominal: Soft. There is no tenderness. There is no rebound  and no guarding.  Musculoskeletal: Normal range of motion. She exhibits no edema or tenderness.  Neurological: She is alert and oriented to person, place, and time. No cranial nerve deficit. She exhibits normal muscle tone. Coordination normal.  No ataxia on finger to nose bilaterally. No pronator drift. 5/5 strength throughout. CN 2-12 intact.Equal grip strength. Sensation intact. No nystagmus, no ataxia on finger to nose, normal gait, negative Romberg  Skin: Skin is warm.  Psychiatric: She has a normal mood and affect. Her behavior is normal.  Nursing note and vitals reviewed.   ED Course  Procedures (including critical care time) Labs Review Labs Reviewed  CBC WITH DIFFERENTIAL/PLATELET  BASIC METABOLIC PANEL  TROPONIN I  PREGNANCY, URINE  URINALYSIS, ROUTINE W REFLEX MICROSCOPIC (NOT AT [facility])    Imaging Review Dg Chest 2 View  09/21/2015  CLINICAL DATA:  Severe headache. Dizziness and shortness of breath, 1 week duration. EXAM: CHEST  2 VIEW COMPARISON:  None. FINDINGS: Heart size is normal. Mediastinal shadows are normal. The lungs are clear. The vascularity is normal. No effusions. There is curvature the spine. No acute bone finding. IMPRESSION: No active cardiopulmonary disease.  Spinal curvature. Electronically Signed   By: Nelson Chimes M.D.   On: 09/21/2015 11:43   I have personally reviewed and evaluated these images and lab results as part of my medical decision-making.   EKG Interpretation None      MDM   Final diagnoses:  Blood pressure check  Dyspnea    Patient with multiple complaints including elevated blood pressure, lightheadedness, shortness of breath, chest tightness, headache. No headache currently. No chest pain or shortness of breath currently. No hypoxia or tachycardia.  EKG normal sinus rhythm. Lungs clear to auscultation bilaterally. Suspect anxiety contributing to her symptoms. Labs reassuring, CXR negative. Blood pressure has improved to  124/81.  Patient instructed to the keep a record of her blood pressure and follow up with PCP. Symptoms may be due to anxiety. No evidence of ACS. Low suspicion for PE. PERC negative. Return precautions discussed. BP well controlled in the ED.    ED ECG REPORT   Date: 09/21/2015  Rate: 77  Rhythm: normal sinus rhythm  QRS Axis: normal  Intervals: normal  ST/T Wave abnormalities: normal  Conduction Disutrbances:none  Narrative Interpretation:   Old EKG Reviewed: none available  I have personally reviewed the EKG tracing and agree with the computerized printout as noted.   Ezequiel Essex, MD 09/21/15 [phone]

## 2015-09-21 NOTE — ED Notes (Signed)
Pt reports she's had some blood pressure fluctuations today (reports home reading of 88/52 this am with some associated dizziness). Also reports shortness of breath for several days (pt able to speak in complete sentences;NAD noted at this time). Also reports migraines (denies hx of) that lasted 48 hrs (Thursday and Friday).

## 2015-09-21 NOTE — Discharge Instructions (Signed)
Hypertension Your blood pressure is normal today. Keep a record on a daily basis as we discussed and establish care with a primary care physician. Return to the ED if you develop chest pain, shortness of breath or any other concerns. Hypertension, commonly called high blood pressure, is when the force of blood pumping through your arteries is too strong. Your arteries are the blood vessels that carry blood from your heart throughout your body. A blood pressure reading consists of a higher number over a lower number, such as 110/72. The higher number (systolic) is the pressure inside your arteries when your heart pumps. The lower number (diastolic) is the pressure inside your arteries when your heart relaxes. Ideally you want your blood pressure below 120/80. Hypertension forces your heart to work harder to pump blood. Your arteries may become narrow or stiff. Having untreated or uncontrolled hypertension can cause heart attack, stroke, kidney disease, and other problems. RISK FACTORS Some risk factors for high blood pressure are controllable. Others are not.  Risk factors you cannot control include:   Race. You may be at higher risk if you are African American.  Age. Risk increases with age.  Gender. Men are at higher risk than women before age 46 years. After age 48, women are at higher risk than men. Risk factors you can control include:  Not getting enough exercise or physical activity.  Being overweight.  Getting too much fat, sugar, calories, or salt in your diet.  Drinking too much alcohol. SIGNS AND SYMPTOMS Hypertension does not usually cause signs or symptoms. Extremely high blood pressure (hypertensive crisis) may cause headache, anxiety, shortness of breath, and nosebleed. DIAGNOSIS To check if you have hypertension, your health care provider will measure your blood pressure while you are seated, with your arm held at the level of your heart. It should be measured at least twice  using the same arm. Certain conditions can cause a difference in blood pressure between your right and left arms. A blood pressure reading that is higher than normal on one occasion does not mean that you need treatment. If it is not clear whether you have high blood pressure, you may be asked to return on a different day to have your blood pressure checked again. Or, you may be asked to monitor your blood pressure at home for 1 or more weeks. TREATMENT Treating high blood pressure includes making lifestyle changes and possibly taking medicine. Living a healthy lifestyle can help lower high blood pressure. You may need to change some of your habits. Lifestyle changes may include:  Following the DASH diet. This diet is high in fruits, vegetables, and whole grains. It is low in salt, red meat, and added sugars.  Keep your sodium intake below 2,300 mg per day.  Getting at least 30-45 minutes of aerobic exercise at least 4 times per week.  Losing weight if necessary.  Not smoking.  Limiting alcoholic beverages.  Learning ways to reduce stress. Your health care provider may prescribe medicine if lifestyle changes are not enough to get your blood pressure under control, and if one of the following is true:  You are 41-62 years of age and your systolic blood pressure is above 140.  You are 4 years of age or older, and your systolic blood pressure is above 150.  Your diastolic blood pressure is above 90.  You have diabetes, and your systolic blood pressure is over [id number] or your diastolic blood pressure is over 90.  You have kidney  disease and your blood pressure is above 140/90.  You have heart disease and your blood pressure is above 140/90. Your personal target blood pressure may vary depending on your medical conditions, your age, and other factors. HOME CARE INSTRUCTIONS  Have your blood pressure rechecked as directed by your health care provider.   Take medicines only as directed by  your health care provider. Follow the directions carefully. Blood pressure medicines must be taken as prescribed. The medicine does not work as well when you skip doses. Skipping doses also puts you at risk for problems.  Do not smoke.   Monitor your blood pressure at home as directed by your health care provider. SEEK MEDICAL CARE IF:   You think you are having a reaction to medicines taken.  You have recurrent headaches or feel dizzy.  You have swelling in your ankles.  You have trouble with your vision. SEEK IMMEDIATE MEDICAL CARE IF:  You develop a severe headache or confusion.  You have unusual weakness, numbness, or feel faint.  You have severe chest or abdominal pain.  You vomit repeatedly.  You have trouble breathing. MAKE SURE YOU:   Understand these instructions.  Will watch your condition.  Will get help right away if you are not doing well or get worse.   This information is not intended to replace advice given to you by your health care provider. Make sure you discuss any questions you have with your health care provider.   Document Released: 04/26/2005 Document Revised: 09/10/2014 Document Reviewed: 02/16/2013 Elsevier Interactive Patient Education [employer].

## 2016-10-08 ENCOUNTER — Other Ambulatory Visit: Payer: Self-pay | Admitting: Obstetrics and Gynecology

## 2016-10-08 DIAGNOSIS — R922 Inconclusive mammogram: Secondary | ICD-10-CM

## 2016-10-11 ENCOUNTER — Ambulatory Visit
Admission: RE | Admit: 2016-10-11 | Discharge: 2016-10-11 | Disposition: A | Discharge status: Home / Self Care | Payer: BLUE CROSS/BLUE SHIELD | Source: Ambulatory Visit | Attending: Obstetrics and Gynecology | Admitting: Obstetrics and Gynecology

## 2016-10-11 DIAGNOSIS — R922 Inconclusive mammogram: Secondary | ICD-10-CM

## 2017-11-28 ENCOUNTER — Other Ambulatory Visit: Payer: Self-pay | Admitting: Obstetrics and Gynecology

## 2017-11-28 DIAGNOSIS — R928 Other abnormal and inconclusive findings on diagnostic imaging of breast: Secondary | ICD-10-CM

## 2017-12-05 ENCOUNTER — Ambulatory Visit
Admission: RE | Admit: 2017-12-05 | Discharge: 2017-12-05 | Disposition: A | Discharge status: Home / Self Care | Payer: BLUE CROSS/BLUE SHIELD | Source: Ambulatory Visit | Attending: Obstetrics and Gynecology | Admitting: Obstetrics and Gynecology

## 2017-12-05 DIAGNOSIS — R928 Other abnormal and inconclusive findings on diagnostic imaging of breast: Secondary | ICD-10-CM

## 2018-12-06 ENCOUNTER — Other Ambulatory Visit: Payer: Self-pay | Admitting: Obstetrics and Gynecology

## 2018-12-06 DIAGNOSIS — R928 Other abnormal and inconclusive findings on diagnostic imaging of breast: Secondary | ICD-10-CM

## 2018-12-08 ENCOUNTER — Ambulatory Visit
Admission: RE | Admit: 2018-12-08 | Discharge: 2018-12-08 | Disposition: A | Discharge status: Home / Self Care | Payer: BC Managed Care – PPO | Source: Ambulatory Visit | Attending: Obstetrics and Gynecology | Admitting: Obstetrics and Gynecology

## 2018-12-08 ENCOUNTER — Ambulatory Visit
Admission: RE | Admit: 2018-12-08 | Discharge: 2018-12-08 | Disposition: A | Discharge status: Home / Self Care | Payer: BLUE CROSS/BLUE SHIELD | Source: Ambulatory Visit | Attending: Obstetrics and Gynecology | Admitting: Obstetrics and Gynecology

## 2018-12-08 ENCOUNTER — Other Ambulatory Visit: Payer: Self-pay

## 2018-12-08 DIAGNOSIS — R928 Other abnormal and inconclusive findings on diagnostic imaging of breast: Secondary | ICD-10-CM

## 2019-03-16 ENCOUNTER — Other Ambulatory Visit: Payer: Self-pay

## 2019-03-16 ENCOUNTER — Emergency Department (HOSPITAL_BASED_OUTPATIENT_CLINIC_OR_DEPARTMENT_OTHER)
Admission: EM | Admit: 2019-03-16 | Discharge: 2019-03-16 | Disposition: A | Discharge status: Home / Self Care | Payer: BC Managed Care – PPO | Attending: Emergency Medicine | Admitting: Emergency Medicine

## 2019-03-16 ENCOUNTER — Encounter (HOSPITAL_BASED_OUTPATIENT_CLINIC_OR_DEPARTMENT_OTHER): Payer: Self-pay | Admitting: *Deleted

## 2019-03-16 DIAGNOSIS — I10 Essential (primary) hypertension: Secondary | ICD-10-CM | POA: Insufficient documentation

## 2019-03-16 DIAGNOSIS — J45909 Unspecified asthma, uncomplicated: Secondary | ICD-10-CM | POA: Diagnosis not present

## 2019-03-16 DIAGNOSIS — N200 Calculus of kidney: Secondary | ICD-10-CM

## 2019-03-16 DIAGNOSIS — R109 Unspecified abdominal pain: Secondary | ICD-10-CM | POA: Diagnosis present

## 2019-03-16 DIAGNOSIS — Z79899 Other long term (current) drug therapy: Secondary | ICD-10-CM | POA: Diagnosis not present

## 2019-03-16 LAB — COMPREHENSIVE METABOLIC PANEL
ALT: 11 U/L (ref 0–44)
AST: 17 U/L (ref 15–41)
Albumin: 3.9 g/dL (ref 3.5–5.0)
Alkaline Phosphatase: 65 U/L (ref 38–126)
Anion gap: 8 (ref 5–15)
BUN: 11 mg/dL (ref 6–20)
CO2: 24 mmol/L (ref 22–32)
Calcium: 9.1 mg/dL (ref 8.9–10.3)
Chloride: 104 mmol/L (ref 98–111)
Creatinine, Ser: 0.68 mg/dL (ref 0.44–1.00)
GFR calc Af Amer: >60 mL/min (ref >60)
GFR calc non Af Amer: >60 mL/min (ref >60)
Glucose, Bld: 127 mg/dL — ABNORMAL HIGH (ref 70–99)
Potassium: 4 mmol/L (ref 3.5–5.1)
Sodium: 136 mmol/L (ref 135–145)
Total Bilirubin: 0.7 mg/dL (ref 0.3–1.2)
Total Protein: 6.9 g/dL (ref 6.5–8.1)

## 2019-03-16 LAB — CBC WITH DIFFERENTIAL/PLATELET
Abs Immature Granulocytes: 0.04 10*3/uL (ref 0.00–0.07)
Basophils Absolute: 0 10*3/uL (ref 0.0–0.1)
Basophils Relative: 0 %
Eosinophils Absolute: 0 10*3/uL (ref 0.0–0.5)
Eosinophils Relative: 0 %
HCT: 40.6 % (ref 36.0–46.0)
Hemoglobin: 13 g/dL (ref 12.0–15.0)
Immature Granulocytes: 0 %
Lymphocytes Relative: 6 %
Lymphs Abs: 0.6 10*3/uL — ABNORMAL LOW (ref 0.7–4.0)
MCH: 31.4 pg (ref 26.0–34.0)
MCHC: 32 g/dL (ref 30.0–36.0)
MCV: 98.1 fL (ref 80.0–100.0)
Monocytes Absolute: 0.4 10*3/uL (ref 0.1–1.0)
Monocytes Relative: 3 %
Neutro Abs: 10.2 10*3/uL — ABNORMAL HIGH (ref 1.7–7.7)
Neutrophils Relative %: 91 %
Platelets: 246 10*3/uL (ref 150–400)
RBC: 4.14 MIL/uL (ref 3.87–5.11)
RDW: 12.2 % (ref 11.5–15.5)
WBC: 11.2 10*3/uL — ABNORMAL HIGH (ref 4.0–10.5)
nRBC: 0 % (ref 0.0–0.2)

## 2019-03-16 LAB — URINALYSIS, MICROSCOPIC (REFLEX)

## 2019-03-16 LAB — URINALYSIS, ROUTINE W REFLEX MICROSCOPIC
Bilirubin Urine: NEGATIVE
Glucose, UA: NEGATIVE mg/dL
Ketones, ur: NEGATIVE mg/dL
Leukocytes,Ua: NEGATIVE
Nitrite: NEGATIVE
Protein, ur: NEGATIVE mg/dL
Specific Gravity, Urine: 1.01 (ref 1.005–1.030)
pH: 7.5 (ref 5.0–8.0)

## 2019-03-16 MED ORDER — ONDANSETRON 4 MG PO TBDP: 4.0000 mg | ORAL_TABLET | Freq: Three times a day (TID) | ORAL | 0 refills | Status: DC | PRN

## 2019-03-16 MED ORDER — OXYCODONE-ACETAMINOPHEN 5-325 MG PO TABS: 1.0000 | ORAL_TABLET | ORAL | 0 refills | Status: DC | PRN

## 2019-03-16 MED ORDER — TAMSULOSIN HCL 0.4 MG PO CAPS: 0.4000 mg | ORAL_CAPSULE | Freq: Every day | ORAL | 0 refills | Status: DC

## 2019-03-16 MED ORDER — KETOROLAC TROMETHAMINE 30 MG/ML IJ SOLN: 15.0000 mg | Freq: Once | INTRAMUSCULAR | Status: DC

## 2019-03-16 MED ORDER — OXYCODONE-ACETAMINOPHEN 5-325 MG PO TABS: 1.0000 | ORAL_TABLET | ORAL | 0 refills | Status: AC | PRN

## 2019-03-16 MED FILL — TAMSULOSIN HCL 0.4 MG CAP: 0.4 | 5 days supply | Qty: 5 | Fill #0

## 2019-03-16 MED FILL — ONDANSETRON ODT 4 MG TABLET: 4 | 2 days supply | Qty: 6 | Fill #0

## 2019-03-16 MED FILL — OXYCODONE-ACETAMINOPHEN 5-3: 5-325 | 2 days supply | Qty: 12 | Fill #0

## 2019-03-16 NOTE — ED Provider Notes (Signed)
[facility] EMERGENCY DEPARTMENT Provider Note   CSN: [id number] Arrival date & time: 03/16/19  1054     History   Chief Complaint Chief Complaint  Patient presents with  . Flank Pain    HPI [name] is a 48 y.o. female.     48 year old female with past medical history including hypertension, melanoma, asthma, kidney stone who presents with left flank pain.  Yesterday evening, she began having dull, achy left flank pain.  She took ibuprofen last night.  She woke up this morning and was still having the pain.  Later it significantly escalated to severe in intensity.  She states it felt like previous kidney stone that she had in 2009.  She had associated nausea, vomiting, and diaphoresis when the pain was severe.  Now since arriving to the ED, the pain has subsided and is currently minimal.  She denies any associated abdominal pain.  She has had some urinary frequency and very slight burning with urination.  Her urine looks darker than usual.  She denies any fevers, diarrhea, or recent illness.  She took 2 ibuprofen this morning.  The history is provided by the patient.  Flank Pain    Past Medical History:  Diagnosis Date  . Asthma   . Cancer ([place]) 2009   melanoma  . Hypertension     Patient Active Problem List   Diagnosis Date Noted  . ALLERGIC RHINITIS 10/14/2007  . LIVER FUNCTION TESTS, ABNORMAL, HX OF 10/14/2007    Past Surgical History:  Procedure Laterality Date  . ablation  2016   uterine  . APPENDECTOMY    . BREAST CYST ASPIRATION  07/09/2002  . MELANOMA EXCISION  2009     OB History   No obstetric history on file.      Home Medications    Prior to Admission medications   Medication Sig Start Date End Date Taking? Authorizing Provider  cetirizine (ZYRTEC) 5 MG tablet Take 5 mg by mouth daily as needed for allergies.    [provider]  ondansetron (ZOFRAN ODT) 4 MG disintegrating tablet Take 1 tablet (4 mg total) by mouth  every 8 (eight) hours as needed for nausea or vomiting. 03/16/19   Ailyn Gladd, Wenda Overland, MD  oxyCODONE-acetaminophen (PERCOCET) 5-325 MG tablet Take 1 tablet by mouth every 4 (four) hours as needed for up to 5 days. 03/16/19 03/21/19  Mysti Haley, Wenda Overland, MD  tamsulosin (FLOMAX) 0.4 MG CAPS capsule Take 1 capsule (0.4 mg total) by mouth daily. 03/16/19   Kacie Huxtable, Wenda Overland, MD    Family History Family History  Problem Relation Age of Onset  . Breast cancer Neg Hx     Social History Social History   Tobacco Use  . Smoking status: Never Smoker  . Smokeless tobacco: Never Used  Substance Use Topics  . Alcohol use: Not on file  . Drug use: No     Allergies   Zithromax [azithromycin]   Review of Systems Review of Systems  Genitourinary: Positive for flank pain.   All other systems reviewed and are negative except that which was mentioned in HPI   Physical Exam Updated Vital Signs BP (!) 142/87 (BP Location: Right Arm)   Pulse 89   Temp 98.4 F (36.9 C) (Oral)   Resp 16   Ht 5\' 8"  (1.727 m)   Wt 64.9 kg   SpO2 100%   BMI 21.74 kg/m   Physical Exam Vitals signs and nursing note reviewed.  Constitutional:  General: She is not in acute distress.    Appearance: She is well-developed.  HENT:     Head: Normocephalic and atraumatic.     Mouth/Throat:     Mouth: Mucous membranes are moist.     Pharynx: Oropharynx is clear.  Eyes:     Conjunctiva/sclera: Conjunctivae normal.  Neck:     Musculoskeletal: Neck supple.  Cardiovascular:     Rate and Rhythm: Normal rate and regular rhythm.     Heart sounds: Normal heart sounds. No murmur.  Pulmonary:     Effort: Pulmonary effort is normal.     Breath sounds: Normal breath sounds.  Abdominal:     General: Bowel sounds are normal. There is no distension.     Palpations: Abdomen is soft.     Tenderness: There is no abdominal tenderness.  Musculoskeletal:     Right lower leg: No edema.     Left lower leg: No  edema.  Skin:    General: Skin is warm and dry.  Neurological:     Mental Status: She is alert and oriented to person, place, and time.     Comments: Fluent speech  Psychiatric:        Judgment: Judgment normal.      ED Treatments / Results  Labs (all labs ordered are listed, but only abnormal results are displayed) Labs Reviewed  URINALYSIS, ROUTINE W REFLEX MICROSCOPIC - Abnormal; Notable for the following components:      Result Value   APPearance CLOUDY (*)    Hgb urine dipstick MODERATE (*)    All other components within normal limits  CBC WITH DIFFERENTIAL/PLATELET - Abnormal; Notable for the following components:   WBC 11.2 (*)    Neutro Abs 10.2 (*)    Lymphs Abs 0.6 (*)    All other components within normal limits  COMPREHENSIVE METABOLIC PANEL - Abnormal; Notable for the following components:   Glucose, Bld 127 (*)    All other components within normal limits  URINALYSIS, MICROSCOPIC (REFLEX) - Abnormal; Notable for the following components:   Bacteria, UA MANY (*)    All other components within normal limits  URINE CULTURE    EKG None  Radiology No results found.  Procedures Ultrasound ED Renal  Date/Time: 03/16/2019 2:16 PM Performed by: Sharlett Iles, MD Authorized by: Sharlett Iles, MD   Procedure details:    Indications comment:  L flank pain   Technique:  L kidney, R kidney and bladderImages: archived Left kidney findings:    Mass: not identified     Nephrolithiasis: not identified     Renal stones: not identified     Perinephric fluid: not identified   Right kidney findings:    Mass: not identified     Nephrolithiasis: not identified     Renal stones: not identified     Perinephric fluid: not identified     Hydronephrosis: none   Bladder findings:    Bladder:  Unable to visualize Comments:     Bladder empty and decompressed, unable to assess for ureteral jets   (including critical care time)  Medications Ordered in  ED Medications - No data to display   Initial Impression / Assessment and Plan / ED Course  I have reviewed the triage vital signs and the nursing notes.  Pertinent labs & imaging results that were available during my care of the patient were reviewed by me and considered in my medical decision making (see chart for details).  Well-appearing on exam, afebrile.  No abdominal tenderness or complaints of abdominal pain.  Her story does suggest kidney stone especially given history.  WBC 11.2, CMP normal. UA w/ blood, some bacteria but no leuks or nitrites. Added urine culture.  Because patient has history of kidney stone and this feels very similar, I felt it was reasonable to refer ago CT imaging.  I discussed renal ultrasound to evaluate for hydronephrosis.  Patient recently changed insurance and currently has a very high deductible.  We discussed risks and benefits of obtaining ultrasound versus foregoing imaging.  Ultimately, decided on bedside ED ultrasound; explained my own limitations as a sonographer and interpreter of imaging.  Bedside ultrasound showed no obvious hydronephrosis of left kidney versus right.  Given that her creatinine is normal and she has had complete resolution of symptoms here, I feel it is reasonable to hold off on formal imaging for now.  I have provided with medications for supportive care at home and provided with urology follow-up information.  She voiced understanding of return precautions and was discharged in good condition.  Final Clinical Impressions(s) / ED Diagnoses   Final diagnoses:  Kidney stone    ED Discharge Orders         Ordered    oxyCODONE-acetaminophen (PERCOCET) 5-325 MG tablet  Every 4 hours PRN,   Status:  Discontinued     03/16/19 1328    tamsulosin (FLOMAX) 0.4 MG CAPS capsule  Daily     03/16/19 1328    ondansetron (ZOFRAN ODT) 4 MG disintegrating tablet  Every 8 hours PRN     03/16/19 1328    oxyCODONE-acetaminophen (PERCOCET)  5-325 MG tablet  Every 4 hours PRN     03/16/19 1329           Rayonna Heldman, Wenda Overland, MD 03/16/19 1450

## 2019-03-16 NOTE — ED Triage Notes (Signed)
Left lower back pain onset last pm worse x 30 minutes this am w n/v  At present pressure but no pain,  Increased urinary freq

## 2019-03-17 LAB — URINE CULTURE: Culture: NO GROWTH

## 2020-06-05 IMAGING — [facility] DIGITAL DIAGNOSTIC BILATERAL MAMMOGRAM WITH TOMO AND CAD
8 series · 8 of 24 positions shown · non-contrast
Comparison: 12/05/2018 and earlier

CLINICAL DATA: Patient returns after screening study for evaluation
of possible RIGHT breast asymmetry and possible LEFT breast mass.

EXAM:
DIGITAL DIAGNOSTIC BILATERAL MAMMOGRAM WITH CAD AND TOMO
ULTRASOUND BILATERAL BREAST

[L MLO synth-2D]
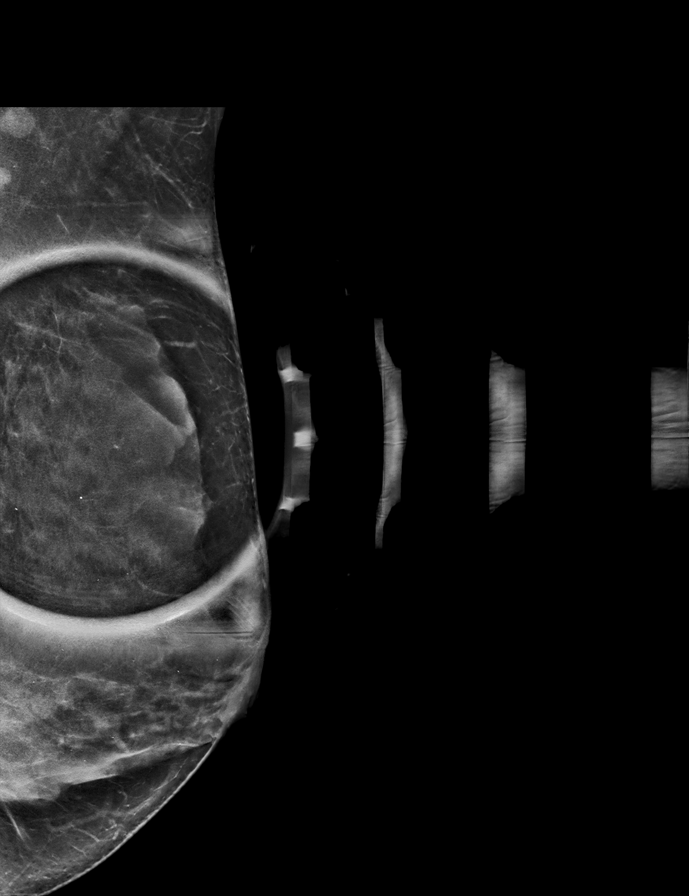

[L CC synth-2D]
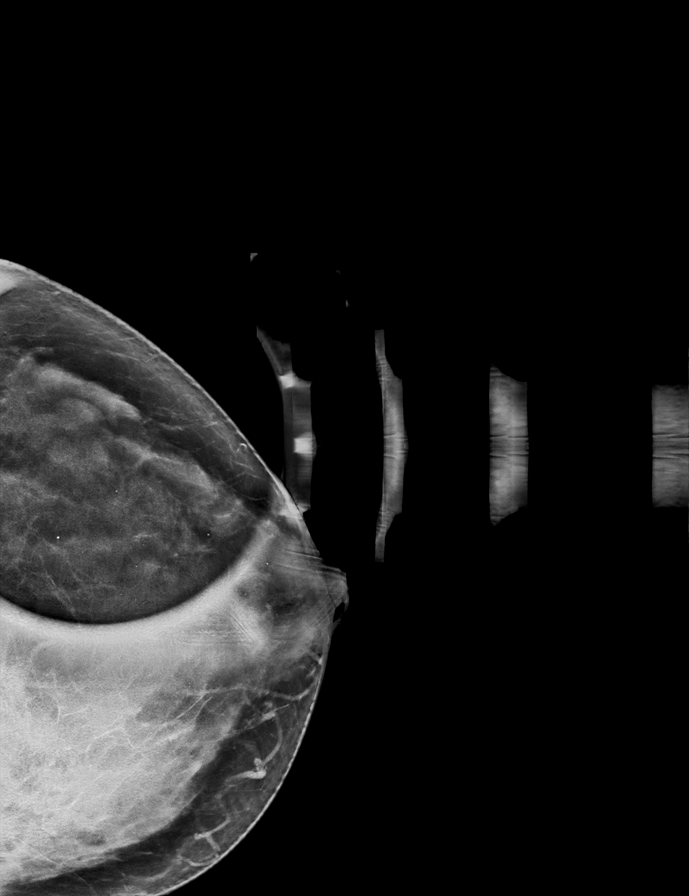

[R CC synth-2D]
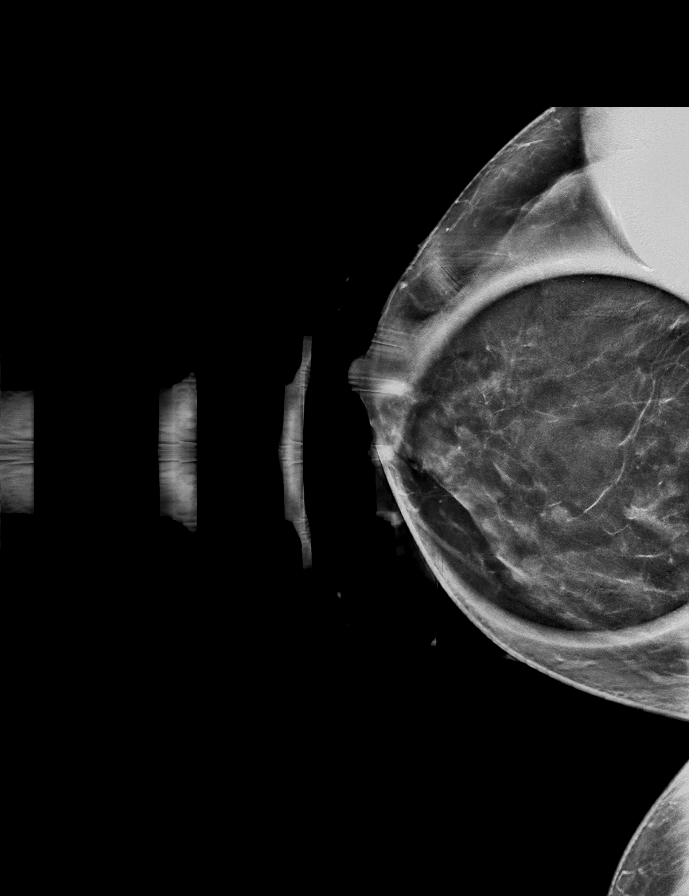

[R MLO synth-2D]
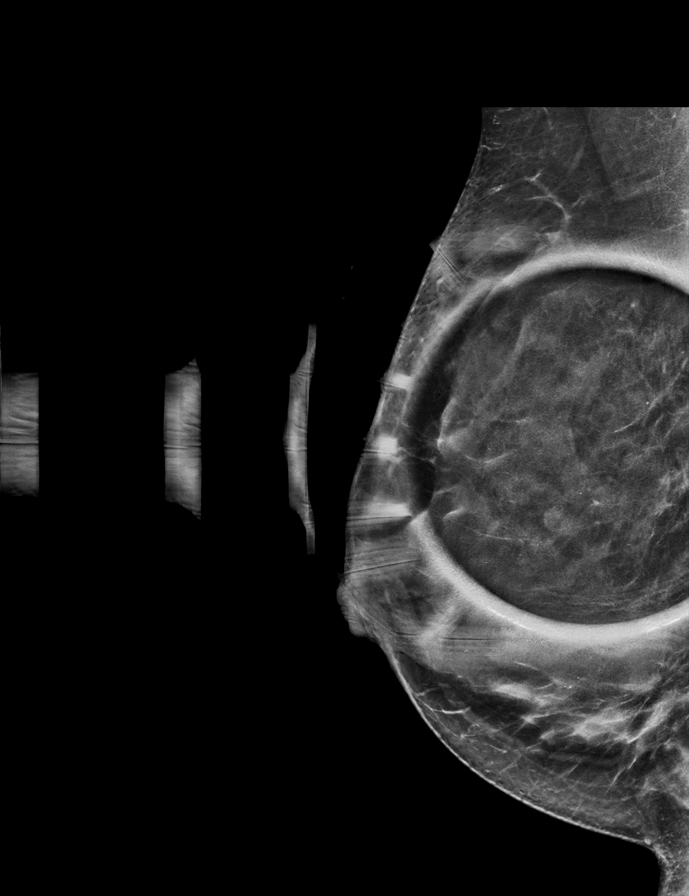

[R MLO tomo · tomo slice 35/68.0]
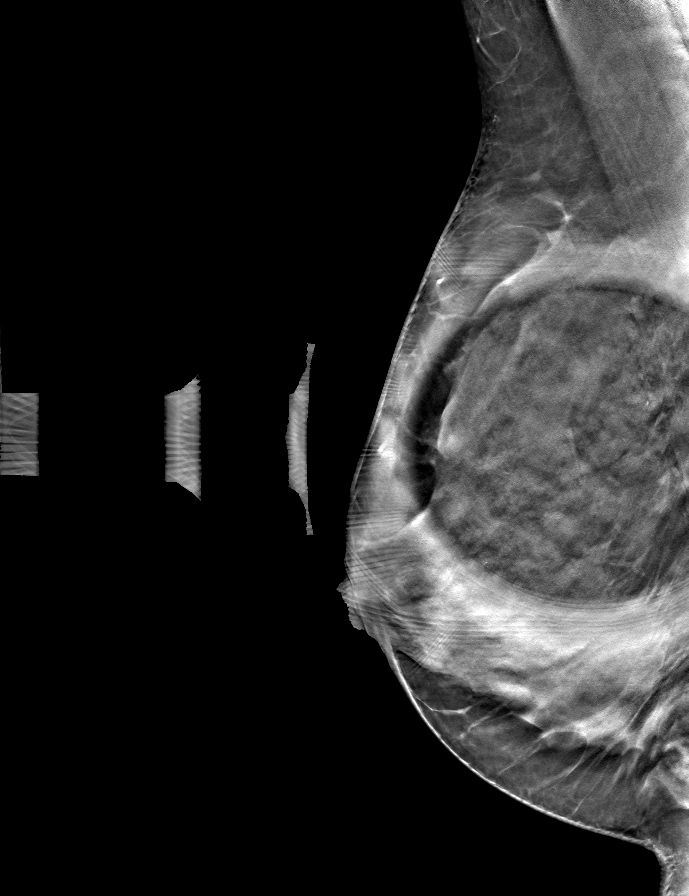

[R CC tomo · tomo slice 35/70.0]
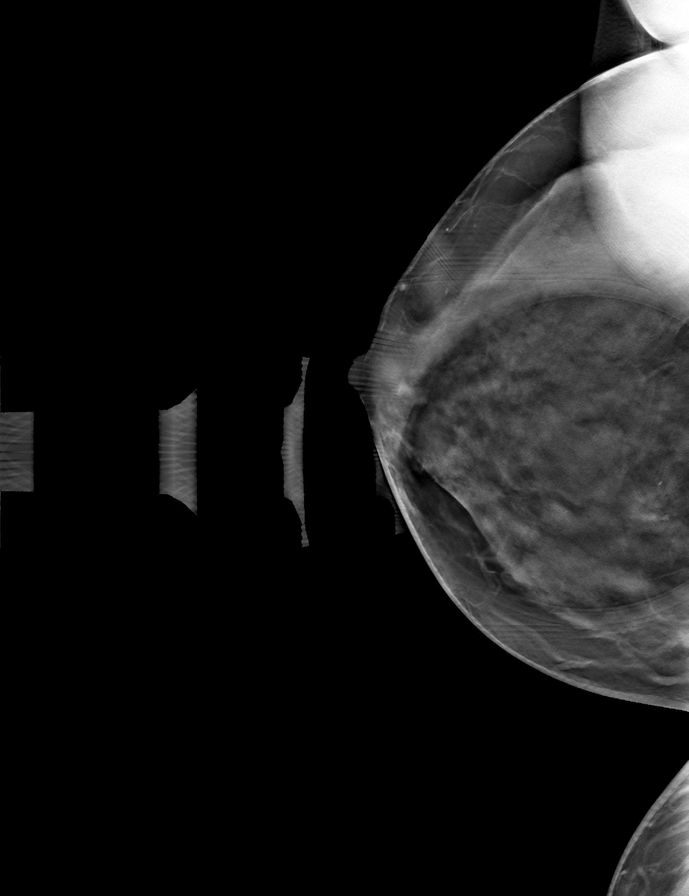

[L CC tomo · tomo slice 35/69.0]
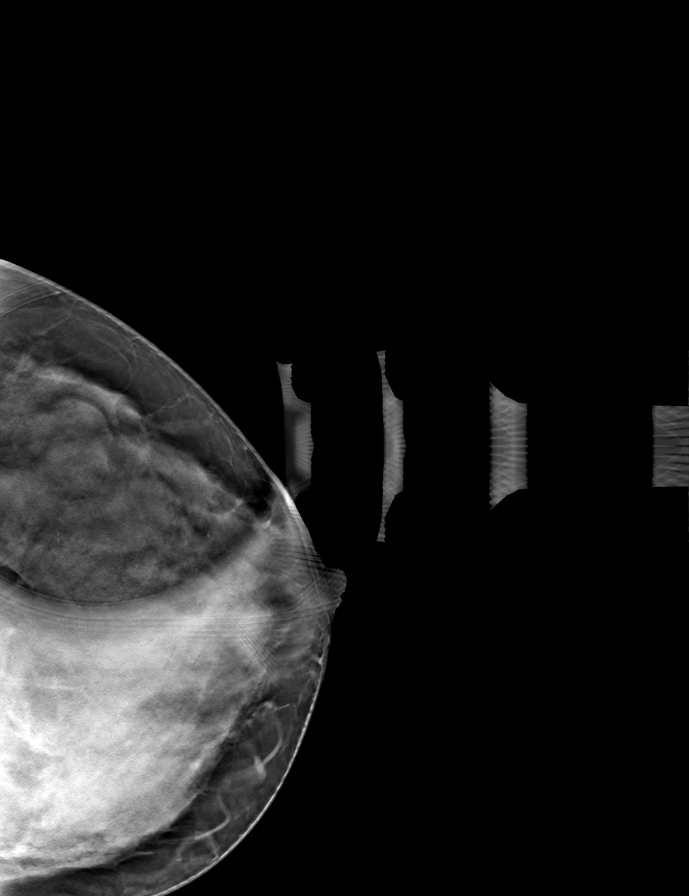

[L MLO tomo · tomo slice 34/67.0]
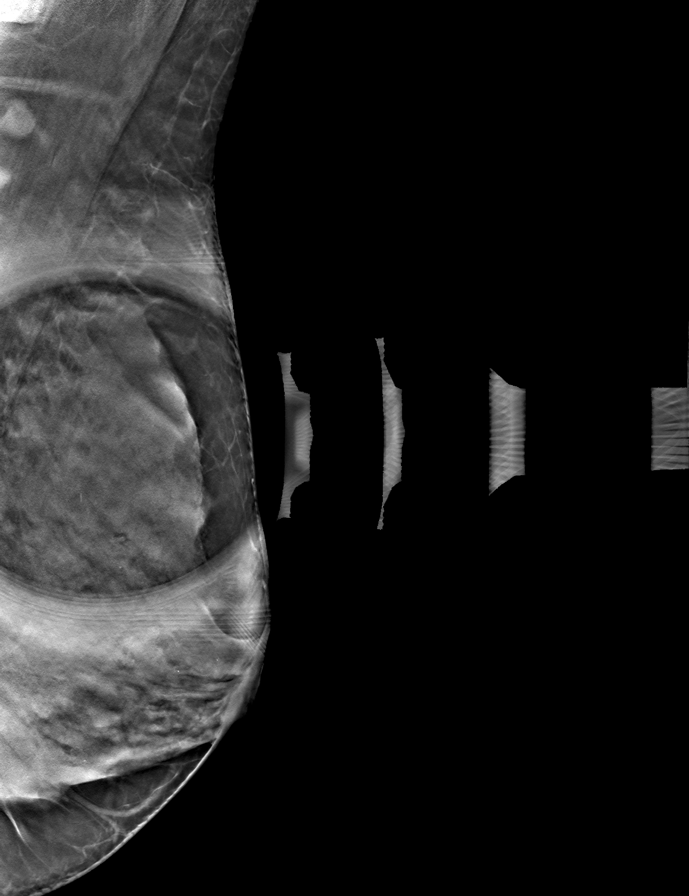

[8 of 24 positions shown; findings below may reference images not displayed]

ACR Breast Density Category d: The breast tissue is extremely dense,
which lowers the sensitivity of mammography.
FINDINGS: Right breast:

Mammogram: Spot compression views confirm presence of a small
rounded mass in the UPPER INNER QUADRANT of the RIGHT breast. Area
of asymmetry in the LOWER INNER QUADRANT of the RIGHT breast is also
confirmed on additional imaging, likely discrete from the
abnormality in the UPPER INNER QUADRANT. Mammographic images were
processed with CAD.

Physical Exam: I palpate no abnormality in the MEDIAL portion of the
RIGHT breast.

Ultrasound: Targeted ultrasound is performed, showing numerous
simple cysts in the LOWER INNER QUADRANT of the RIGHT breast. In the
4 o'clock location 4 centimeters from the nipple. A representative
cyst is 7 millimeters in largest diameter. No solid mass or areas of
acoustic shadowing are identified in this portion of the breast.
Initially in the 2:30 o'clock location RIGHT breast 6 centimeters
from nipple, there is a simple cyst measuring 0.6 x 0.3 x
centimeters. There are adjacent additional sub centimeter cysts in
this portion of the breast.

Left breast:

Mammogram: Additional 2-D and 3-D images are performed. These views
confirm presence of a circumscribed mass in the UPPER-OUTER QUADRANT
of the LEFT breast and further evaluated with ultrasound.
Mammographic images were processed with CAD.

Ultrasound: Targeted ultrasound is performed, showing Numerous
simple cysts within the 2 o'clock location of the LEFT breast,
largest measuring approximately 2.5 centimeters. No solid mass or
areas of acoustic shadowing.
IMPRESSION: Numerous bilateral simple cysts and fibrocystic changes. No
mammographic or ultrasound evidence for malignancy.

RECOMMENDATION:
Screening mammogram in one year.(Code:[id number]-X-0EQ)

I have discussed the findings and recommendations with the patient.
Results were also provided in writing at the conclusion of the
visit. If applicable, a reminder letter will be sent to the patient
regarding the next appointment.

BI-RADS CATEGORY  2: Benign.

## 2020-06-05 IMAGING — US ULTRASOUND LEFT BREAST LIMITED
1 series · 10 of 10 positions shown · non-contrast
Comparison: 12/05/2018 and earlier

CLINICAL DATA: Patient returns after screening study for evaluation
of possible RIGHT breast asymmetry and possible LEFT breast mass.

EXAM:
DIGITAL DIAGNOSTIC BILATERAL MAMMOGRAM WITH CAD AND TOMO
ULTRASOUND BILATERAL BREAST

[Series 1: ultrasound left breast limited · 0.07mm/px · 10 of 10 slices shown]
[im 1/10]
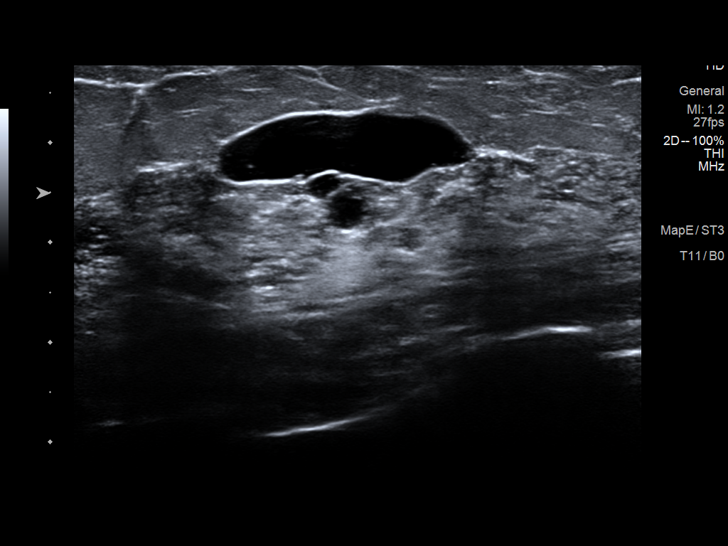
[im 2/10]
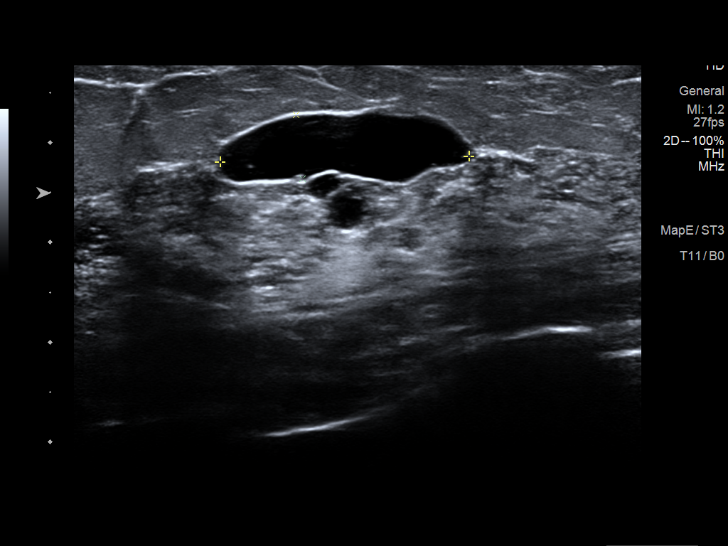
[im 3/10]
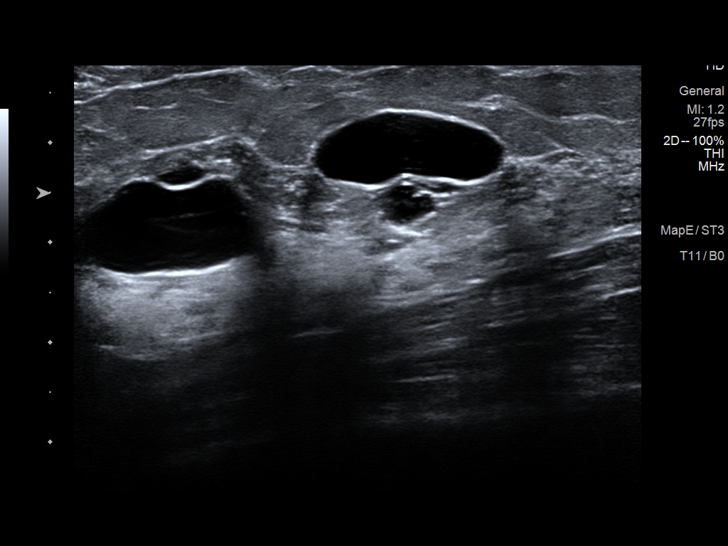
[im 4/10]
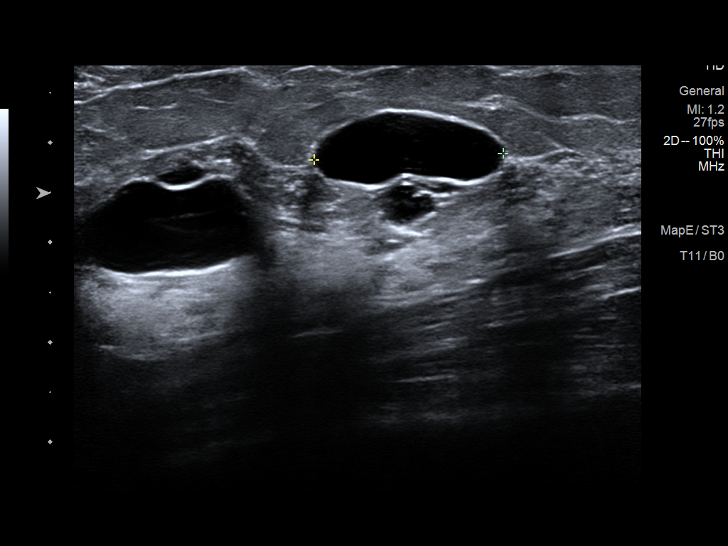
[im 5/10]
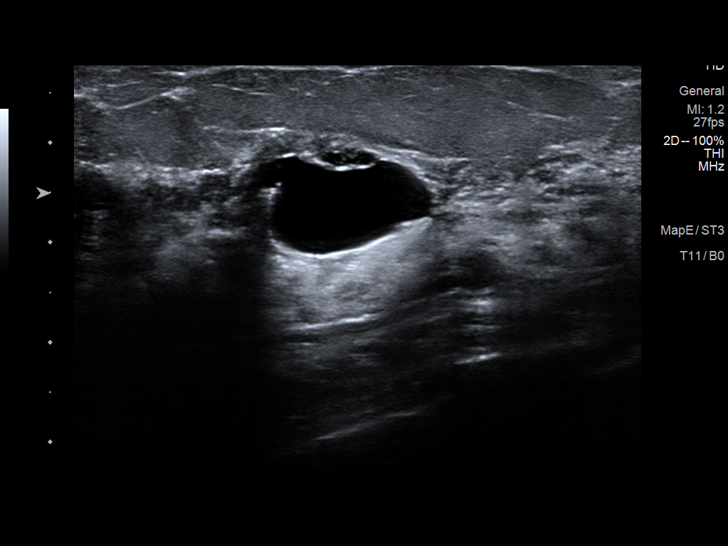
[im 6/10]
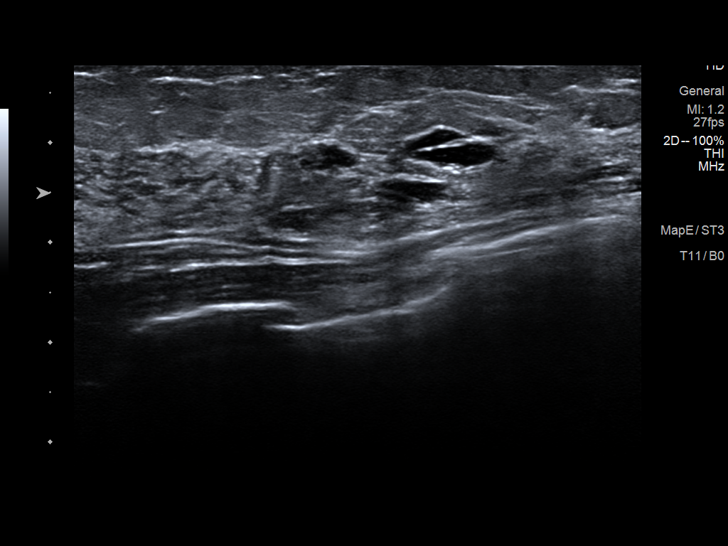
[im 7/10]
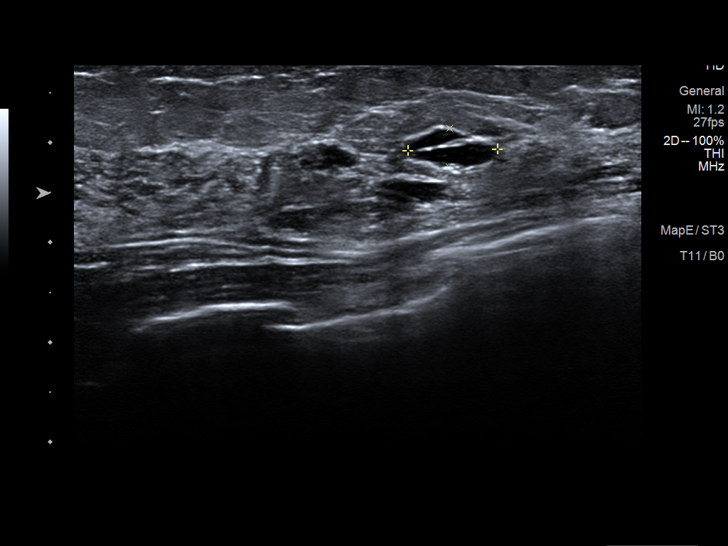
[im 8/10]
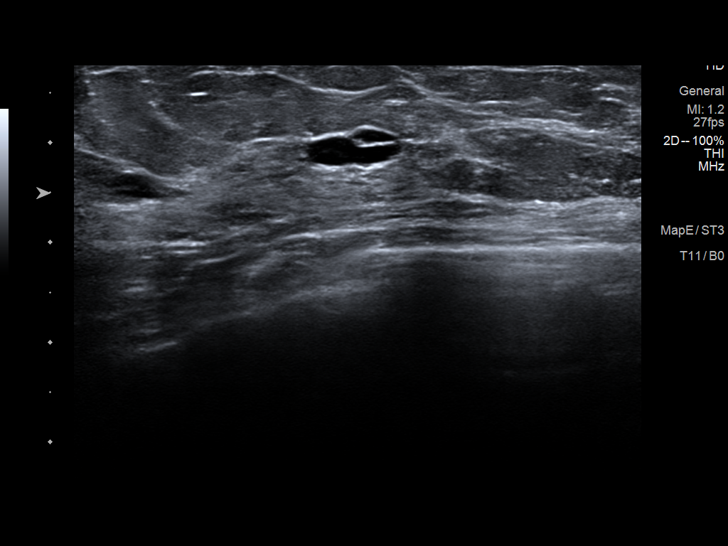
[im 9/10]
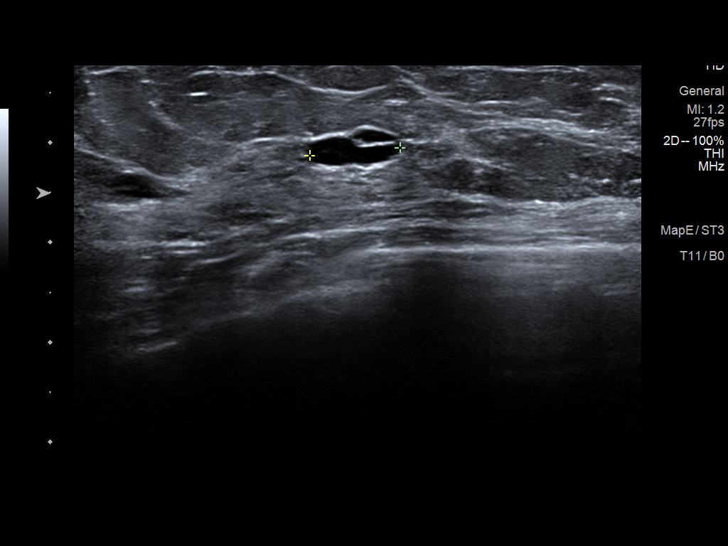
[im 10/10]
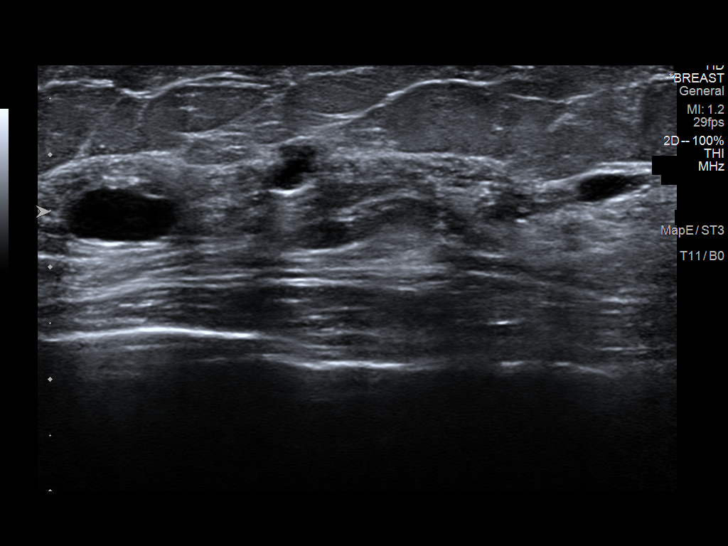

[10 of 10 positions shown; findings below may reference images not displayed]

ACR Breast Density Category d: The breast tissue is extremely dense,
which lowers the sensitivity of mammography.
FINDINGS: Right breast:

Mammogram: Spot compression views confirm presence of a small
rounded mass in the UPPER INNER QUADRANT of the RIGHT breast. Area
of asymmetry in the LOWER INNER QUADRANT of the RIGHT breast is also
confirmed on additional imaging, likely discrete from the
abnormality in the UPPER INNER QUADRANT. Mammographic images were
processed with CAD.

Physical Exam: I palpate no abnormality in the MEDIAL portion of the
RIGHT breast.

Ultrasound: Targeted ultrasound is performed, showing numerous
simple cysts in the LOWER INNER QUADRANT of the RIGHT breast. In the
4 o'clock location 4 centimeters from the nipple. A representative
cyst is 7 millimeters in largest diameter. No solid mass or areas of
acoustic shadowing are identified in this portion of the breast.
Initially in the 2:30 o'clock location RIGHT breast 6 centimeters
from nipple, there is a simple cyst measuring 0.6 x 0.3 x
centimeters. There are adjacent additional sub centimeter cysts in
this portion of the breast.

Left breast:

Mammogram: Additional 2-D and 3-D images are performed. These views
confirm presence of a circumscribed mass in the UPPER-OUTER QUADRANT
of the LEFT breast and further evaluated with ultrasound.
Mammographic images were processed with CAD.

Ultrasound: Targeted ultrasound is performed, showing Numerous
simple cysts within the 2 o'clock location of the LEFT breast,
largest measuring approximately 2.5 centimeters. No solid mass or
areas of acoustic shadowing.
IMPRESSION: Numerous bilateral simple cysts and fibrocystic changes. No
mammographic or ultrasound evidence for malignancy.

RECOMMENDATION:
Screening mammogram in one year.(Code:[id number])

I have discussed the findings and recommendations with the patient.
Results were also provided in writing at the conclusion of the
visit. If applicable, a reminder letter will be sent to the patient
regarding the next appointment.

BI-RADS CATEGORY  2: Benign.

## 2021-10-04 NOTE — Progress Notes (Unsigned)
Cardiology Office Note   Date:  10/09/2021   ID:  Camile, Esters 12/06/1970, MRN [id number]  PCP:  Chesley Noon, MD  Cardiologist:   Shara Hartis Martinique, MD   Chief Complaint  Patient presents with   Hypertension      History of Present Illness: [name] is a 51 y.o. female who is seen at the request of Dr Melford Aase for evaluation of HTN. She has a history of elevated BP without diagnosis of HTN. Was seen by [facility] cardiology in May for some vague palpitations and elevated BP.  Had a prior stress Echo in 2021 there. She states in May she was under a lot of stress and BP was elevated up to 165/100. She obtained a home BP monitor and has been tracking her BP. Typically reads low 629B systolic and 28U diastolic. She generally feels well. Denies any palpitations currently. No chest pain or dyspnea. Does have a bad family history of CAD on her father's side.    Past Medical History:  Diagnosis Date   Asthma    Cancer ([place]) 2009   melanoma   Hypertension     Past Surgical History:  Procedure Laterality Date   ablation  2016   uterine   APPENDECTOMY     BREAST CYST ASPIRATION  07/09/2002   MELANOMA EXCISION  2009     Current Outpatient Medications  Medication Sig Dispense Refill   cetirizine (ZYRTEC) 5 MG tablet Take 5 mg by mouth daily as needed for allergies.     estradiol (ESTRACE) 2 MG tablet estradiol 2 mg tablet  TAKE 1 TABLET BY MOUTH EVERY DAY     losartan (COZAAR) 50 MG tablet Take 1 tablet (50 mg total) by mouth daily. 90 tablet 3   progesterone (PROMETRIUM) 100 MG capsule progesterone micronized 100 mg capsule  TAKE 1 CAPSULE BY MOUTH EVERYDAY AT BEDTIME     No current facility-administered medications for this visit.    Allergies:   Zithromax [azithromycin]    Social History:  The patient  reports that she has never smoked. She has never used smokeless tobacco. She reports that she does not use drugs.   Family History:  The patient's family history  includes Arrhythmia in her father; Heart attack in her maternal uncle; Heart disease in her father and maternal uncle; Hyperlipidemia in her brother; Hypertension in her paternal aunt.    ROS:  Please see the history of present illness.   Otherwise, review of systems are positive for none.   All other systems are reviewed and negative.    PHYSICAL EXAM: VS:  BP 138/88   Pulse 92   Ht '5\' 8"'$  (1.727 m)   Wt 141 lb (64 kg)   SpO2 100%   BMI 21.44 kg/m  , BMI Body mass index is 21.44 kg/m. GEN: Well nourished, well developed, in no acute distress HEENT: normal Neck: no JVD, carotid bruits, or masses Cardiac: RRR; no murmurs, rubs, or gallops,no edema  Respiratory:  clear to auscultation bilaterally, normal work of breathing GI: soft, nontender, nondistended, + BS MS: no deformity or atrophy Skin: warm and dry, no rash Neuro:  Strength and sensation are intact Psych: euthymic mood, full affect   EKG:  EKG is not ordered today. Had recent Ecg at [facility] that was reported normal.    Recent Labs: No results found for requested labs within last 8760 hours.    Lipid Panel No results found for: CHOL, TRIG, HDL, CHOLHDL,  VLDL, LDLCALC, LDLDIRECT   Labs dated 09/21/21: cholesterol 190, triglycerides 85, HDL 85, LDL 90. CBC, CMET, TSH normal. BNP 17.2   Wt Readings from Last 3 Encounters:  10/09/21 141 lb (64 kg)  03/16/19 143 lb (64.9 kg)  09/21/15 140 lb (63.5 kg)      Other studies Reviewed: Additional studies/ records that were reviewed today include: see HPI. Review of the above records demonstrates: Normal stress Echo in 2021   ASSESSMENT AND PLAN:  1.  Essential HTN. BP consistently high. Discussed lifestyle modification with regular aerobic exercise. Dietary modification to restrict sodium intake and focus on more fresh fruits and vegetables and less red meat. I do think she needs antihypertensive therapy. Will start losartan 50 mg daily. She will monitor BP at home.  Arrange follow up in 3-4 months. I don't feel any further cardiac work is needed at this time. I don't recommend ASA at this time.  2. Borderline elevated cholesterol. HDL is nice and high. For primary prevention I would focus on dietary and lifestyle modification now. Repeat lipids after several months.  3. Normal stress Echo 2021.    Current medicines are reviewed at length with the patient today.  The patient does not have concerns regarding medicines.  The following changes have been made:  add losartan 50 mg daily. Stop ASA daily. I would not start on statin at this time.   Labs/ tests ordered today include:  No orders of the defined types were placed in this encounter.        Disposition:   FU with me in 3 months  Signed, Amore Grater Martinique, MD  10/09/2021 11:30 AM    [facility] [address] [place], [place], [address] Phone [phone], Fax [phone]

## 2021-10-09 ENCOUNTER — Ambulatory Visit (INDEPENDENT_AMBULATORY_CARE_PROVIDER_SITE_OTHER): Payer: BC Managed Care – PPO | Admitting: Cardiology

## 2021-10-09 ENCOUNTER — Encounter: Payer: Self-pay | Admitting: Cardiology

## 2021-10-09 VITALS — BP 138/88 | HR 92 | Ht 68.0 in | Wt 141.0 lb

## 2021-10-09 DIAGNOSIS — I10 Essential (primary) hypertension: Secondary | ICD-10-CM

## 2021-10-09 MED ORDER — LOSARTAN POTASSIUM 50 MG PO TABS: 50.0000 mg | ORAL_TABLET | Freq: Every day | ORAL | 3 refills | Status: DC

## 2021-10-09 NOTE — Patient Instructions (Addendum)
Medication Instructions:    Losartan 50 mg daily   Stop taking Aspirin Stop taking pravastatin   *If you need a refill on your cardiac medications before your next appointment, please call your pharmacy*   Lab Work:  Not needed   Testing/Procedures: Not needed   Follow-Up: At [facility], you and your health needs are our priority.  As part of our continuing mission to provide you with exceptional heart care, we have created designated Provider Care Teams.  These Care Teams include your primary Cardiologist (physician) and Advanced Practice Providers (APPs -  Physician Assistants and Nurse Practitioners) who all work together to provide you with the care you need, when you need it.     Your next appointment:   3 to 4 month(s)  The format for your next appointment:   In Person  Provider:   None    Other Instructions   Risk factor modification   Limit salt intake   Watching diet   Increase  exercise

## 2022-01-24 NOTE — Progress Notes (Unsigned)
Cardiology Office Note   Date:  01/27/2022   ID:  [name], House [birth year], MRN [id number]  PCP:  Chesley Noon, MD  Cardiologist:   Juan Kissoon Martinique, MD   Chief Complaint  Patient presents with   Hypertension      History of Present Illness: [name] is a 51 y.o. female who is seen for follow up of HTN. She has a history of elevated BP . Was seen by [facility] cardiology in May for some vague palpitations and elevated BP.  Had a prior stress Echo in 2021 there. She states in May she was under a lot of stress and BP was elevated up to 165/100. She obtained a home BP monitor and has been tracking her BP. Typically reads low 409B systolic and 35H diastolic. She generally feels well. Denies any palpitations currently. No chest pain or dyspnea. Does have a bad family history of CAD on her father's side. On her last visit we started her on losartan 50 mg daily.   On follow up today she is feeling better. Notes BP at home is well controlled. Is tolerating medication well. Eating healthy and staying active.    Past Medical History:  Diagnosis Date   Asthma    Cancer ([place]) 2009   melanoma   Hypertension     Past Surgical History:  Procedure Laterality Date   ablation  2016   uterine   APPENDECTOMY     BREAST CYST ASPIRATION  07/09/2002   MELANOMA EXCISION  2009     Current Outpatient Medications  Medication Sig Dispense Refill   cetirizine (ZYRTEC) 5 MG tablet Take 5 mg by mouth daily as needed for allergies.     estradiol (ESTRACE) 2 MG tablet estradiol 2 mg tablet  TAKE 1 TABLET BY MOUTH EVERY DAY     losartan (COZAAR) 50 MG tablet Take 1 tablet (50 mg total) by mouth daily. 90 tablet 3   progesterone (PROMETRIUM) 100 MG capsule progesterone micronized 100 mg capsule  TAKE 1 CAPSULE BY MOUTH EVERYDAY AT BEDTIME     valACYclovir (VALTREX) 1000 MG tablet as needed.     No current facility-administered medications for this visit.    Allergies:   Zithromax  [azithromycin]    Social History:  The patient  reports that she has never smoked. She has never used smokeless tobacco. She reports that she does not use drugs.   Family History:  The patient's family history includes Arrhythmia in her father; Heart attack in her maternal uncle; Heart disease in her father and maternal uncle; Hyperlipidemia in her brother; Hypertension in her paternal aunt.    ROS:  Please see the history of present illness.   Otherwise, review of systems are positive for none.   All other systems are reviewed and negative.    PHYSICAL EXAM: VS:  BP 128/70   Pulse 85   Ht 5' 7.5" (1.715 m)   Wt 145 lb (65.8 kg)   SpO2 98%   BMI 22.38 kg/m  , BMI Body mass index is 22.38 kg/m. GEN: Well nourished, well developed, in no acute distress HEENT: normal Neck: no JVD, carotid bruits, or masses Cardiac: RRR; no murmurs, rubs, or gallops,no edema  Respiratory:  clear to auscultation bilaterally, normal work of breathing GI: soft, nontender, nondistended, + BS MS: no deformity or atrophy Skin: warm and dry, no rash Neuro:  Strength and sensation are intact Psych: euthymic mood, full affect   EKG:  EKG  is not ordered today. Had recent Ecg at [facility] that was reported normal.    Recent Labs: No results found for requested labs within last 365 days.    Lipid Panel No results found for: "CHOL", "TRIG", "HDL", "CHOLHDL", "VLDL", "LDLCALC", "LDLDIRECT"   Labs dated 09/21/21: cholesterol 190, triglycerides 85, HDL 85, LDL 90. CBC, CMET, TSH normal. BNP 17.2   Wt Readings from Last 3 Encounters:  01/27/22 145 lb (65.8 kg)  10/09/21 141 lb (64 kg)  03/16/19 143 lb (64.9 kg)      Other studies Reviewed: Additional studies/ records that were reviewed today include: see HPI. Review of the above records demonstrates: Normal stress Echo in 2021   ASSESSMENT AND PLAN:  1.  Essential HTN. BP much better on losartan. Will check BMET today.  Discussed lifestyle  modification with regular aerobic exercise. Dietary modification to restrict sodium intake and focus on more fresh fruits and vegetables and less red meat. Will follow up in one year.  2. Borderline elevated cholesterol. HDL is nice and high. For primary prevention I would focus on dietary and lifestyle modification now.  3. Normal stress Echo 2021.    Current medicines are reviewed at length with the patient today.  The patient does not have concerns regarding medicines.  The following changes have been made: none  Labs/ tests ordered today include:  No orders of the defined types were placed in this encounter.        Disposition:   FU with me in 12 months  Signed, Khadeeja Elden Martinique, MD  01/27/2022 10:36 AM    [facility] [address], [place], [place], [address] Phone [phone], Fax [phone]

## 2022-01-27 ENCOUNTER — Encounter: Payer: Self-pay | Admitting: Cardiology

## 2022-01-27 ENCOUNTER — Ambulatory Visit: Payer: BC Managed Care – PPO | Attending: Cardiology | Admitting: Cardiology

## 2022-01-27 VITALS — BP 128/70 | HR 85 | Ht 67.5 in | Wt 145.0 lb

## 2022-01-27 DIAGNOSIS — I10 Essential (primary) hypertension: Secondary | ICD-10-CM

## 2022-01-27 LAB — BASIC METABOLIC PANEL
BUN/Creatinine Ratio: 11 (ref 9–23)
BUN: 8 mg/dL (ref 6–24)
CO2: 26 mmol/L (ref 20–29)
Calcium: 9.7 mg/dL (ref 8.7–10.2)
Chloride: 102 mmol/L (ref 96–106)
Creatinine, Ser: 0.7 mg/dL (ref 0.57–1.00)
Glucose: 94 mg/dL (ref 70–99)
Potassium: 4.8 mmol/L (ref 3.5–5.2)
Sodium: 138 mmol/L (ref 134–144)
eGFR: 103 mL/min/{1.73_m2} (ref >59)

## 2022-02-10 ENCOUNTER — Telehealth: Payer: Self-pay | Admitting: Internal Medicine

## 2022-02-10 NOTE — Telephone Encounter (Signed)
Good Afternoon Dr.Perry,  We received records on this patient to transfer her care to [facility]. She is requesting this transfer because we are in-network with her insurance. Her husband is also requesting a transfer, they are wanting to be seen by the same GI provider and you have come highly recommended.   We have records for review, please advise on scheduling. Thank you.

## 2022-02-12 NOTE — Telephone Encounter (Signed)
GI RECORDS REVIEWED  Okay to schedule routine GI office visit when needed.  Docia Chuck. Geri Seminole., M.D. [facility] Division of Gastroenterology

## 2022-02-18 ENCOUNTER — Encounter: Payer: Self-pay | Admitting: Internal Medicine

## 2022-02-19 ENCOUNTER — Encounter: Payer: Self-pay | Admitting: Internal Medicine

## 2022-04-07 ENCOUNTER — Ambulatory Visit (INDEPENDENT_AMBULATORY_CARE_PROVIDER_SITE_OTHER): Payer: BC Managed Care – PPO | Admitting: Internal Medicine

## 2022-04-07 ENCOUNTER — Encounter: Payer: Self-pay | Admitting: Internal Medicine

## 2022-04-07 VITALS — BP 128/78 | HR 90 | Ht 68.0 in | Wt 145.0 lb

## 2022-04-07 DIAGNOSIS — Z86010 Personal history of colonic polyps: Secondary | ICD-10-CM | POA: Diagnosis not present

## 2022-04-07 MED ORDER — NA SULFATE-K SULFATE-MG SULF 17.5-3.13-1.6 GM/177ML PO SOLN: 1.0000 | Freq: Once | ORAL | 0 refills | Status: AC

## 2022-04-07 NOTE — Patient Instructions (Signed)
_______________________________________________________  If you are age 53 or older, your body mass index should be between 23-30. Your Body mass index is 22.05 kg/m. If this is out of the aforementioned range listed, please consider follow up with your Primary Care Provider.  If you are age 58 or younger, your body mass index should be between 19-25. Your Body mass index is 22.05 kg/m. If this is out of the aformentioned range listed, please consider follow up with your Primary Care Provider.   ________________________________________________________  The [facility] providers would like to encourage you to use [facility] to communicate with providers for non-urgent requests or questions.  Due to long hold times on the telephone, sending your provider a message by [facility] may be a faster and more efficient way to get a response.  Please allow 48 business hours for a response.  Please remember that this is for non-urgent requests.  _______________________________________________________  Dennis Bast have been scheduled for a colonoscopy. Please follow written instructions given to you at your visit today.  Please pick up your prep supplies at the pharmacy within the next 1-3 days. If you use inhalers (even only as needed), please bring them with you on the day of your procedure.

## 2022-04-07 NOTE — Progress Notes (Signed)
HISTORY OF PRESENT ILLNESS:  [name] is a 51 y.o. 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  She is establishing with this practice as her previous gastroenterologist, Dr. Earlean Shawl, has retired.  Outside records have been reviewed.  Patient underwent routine screening colonoscopy January 29, 2019.  She was found to have a 3 cm pedunculated tubular adenoma in the ascending colon which was removed.  Adjacent marking tattoo placed.  The colon, including the ileum, was described as otherwise normal.  Follow-up in 3 years recommended.  The patient denies a family history of colon cancer.  GI review of systems is entirely negative.  Review of outside blood work from January 27, 2022 shows a normal basic metabolic panel.    REVIEW OF SYSTEMS:  All non-GI ROS negative unless otherwise stated in the HPI. Past Medical History:  Diagnosis Date   Asthma    Cancer ([place]) 2009   melanoma   Hypertension     Past Surgical History:  Procedure Laterality Date   ablation  2016   uterine   APPENDECTOMY     BREAST CYST ASPIRATION  07/09/2002   MELANOMA EXCISION  2009    Social History [name]  reports that she has never smoked. She has never used smokeless tobacco. She reports current alcohol use of about 2.0 standard drinks of alcohol per week. She reports that she does not use drugs.  family history includes Arrhythmia in her father; Heart attack in her maternal uncle; Heart disease in her father and maternal uncle; Hyperlipidemia in her brother; Hypertension in her paternal aunt; Pancreatic cancer in her paternal aunt.  Allergies  Allergen Reactions   Zithromax [Azithromycin] Nausea Only       PHYSICAL EXAMINATION: Vital signs: BP 128/78   Pulse 90   Ht '5\' 8"'$  (1.727 m)   Wt 145 lb (65.8 kg)   SpO2 98%    BMI 22.05 kg/m   Constitutional: generally well-appearing, no acute distress Psychiatric: alert and oriented x3, cooperative Eyes: extraocular movements intact, anicteric, conjunctiva pink Mouth: oral pharynx moist, no lesions Neck: supple no lymphadenopathy Cardiovascular: heart regular rate and rhythm, no murmur Lungs: clear to auscultation bilaterally Abdomen: soft, nontender, nondistended, no obvious ascites, no peritoneal signs, normal bowel sounds, no organomegaly Rectal: Deferred until colonoscopy Extremities: no clubbing, cyanosis, or lower extremity edema bilaterally Skin: no lesions on visible extremities Neuro: No focal deficits.  Cranial nerves intact  ASSESSMENT:  1.  Personal history of advanced adenomatous colon polyp September 2020. 2.  General medical problems.  Stable   PLAN:  1.  Surveillance colonoscopy.  Appropriate candidate without contraindication.The nature of the procedure, as well as the risks, benefits, and alternatives were carefully and thoroughly reviewed with the patient. Ample time for discussion and questions allowed. The patient understood, was satisfied, and agreed to proceed.

## 2022-04-19 ENCOUNTER — Encounter: Payer: BC Managed Care – PPO | Admitting: Internal Medicine

## 2022-05-28 ENCOUNTER — Encounter: Payer: Self-pay | Admitting: Internal Medicine

## 2022-06-01 ENCOUNTER — Encounter: Payer: Self-pay | Admitting: Internal Medicine

## 2022-06-01 NOTE — Telephone Encounter (Signed)
1.  You had an advanced adenoma (3 cm polyp) on your last examination in 2020. 2.  The appropriate follow-up interval, based on the current guidelines, would be a colonoscopy in 3 years (or 2023).  Thus, this was scheduled. 3.  In terms of the insurance, this would not be considered a routine screening exam but rather a surveillance exam (because of a history of precancerous polyps).  I am not sure what your coverage responsibility would be.  This can vary quite a bit depending upon the particular insurance. 4.  My medical recommendation will be for follow-up colonoscopy at this time.  Unfortunately, there are no suitable alternatives for this particular issue Best, Dr. Henrene Pastor

## 2022-06-04 ENCOUNTER — Ambulatory Visit (AMBULATORY_SURGERY_CENTER): Payer: BC Managed Care – PPO | Admitting: Internal Medicine

## 2022-06-04 ENCOUNTER — Encounter: Payer: Self-pay | Admitting: Internal Medicine

## 2022-06-04 VITALS — BP 115/73 | HR 72 | Temp 98.6°F | Resp 19 | Ht 68.0 in | Wt 145.0 lb

## 2022-06-04 DIAGNOSIS — Z09 Encounter for follow-up examination after completed treatment for conditions other than malignant neoplasm: Secondary | ICD-10-CM

## 2022-06-04 DIAGNOSIS — Z86010 Personal history of colonic polyps: Secondary | ICD-10-CM | POA: Diagnosis not present

## 2022-06-04 MED ORDER — SODIUM CHLORIDE 0.9 % IV SOLN: 500.0000 mL | Freq: Once | INTRAVENOUS | Status: AC

## 2022-06-04 NOTE — Progress Notes (Signed)
HISTORY OF PRESENT ILLNESS:   [name] is a 52 y.o. 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  She is establishing with this practice as her previous gastroenterologist, Dr. Earlean Shawl, has retired.   Outside records have been reviewed.  Patient underwent routine screening colonoscopy January 29, 2019.  She was found to have a 3 cm pedunculated tubular adenoma in the ascending colon which was removed.  Adjacent marking tattoo placed.  The colon, including the ileum, was described as otherwise normal.  Follow-up in 3 years recommended.   The patient denies a family history of colon cancer.  GI review of systems is entirely negative.  Review of outside blood work from January 27, 2022 shows a normal basic metabolic panel.       REVIEW OF SYSTEMS:   All non-GI ROS negative unless otherwise stated in the HPI.     Past Medical History:  Diagnosis Date   Asthma     Cancer ([place]) 2009    melanoma   Hypertension             Past Surgical History:  Procedure Laterality Date   ablation   2016    uterine   APPENDECTOMY       BREAST CYST ASPIRATION   07/09/2002   MELANOMA EXCISION   2009      Social History DARRYL BLUMENSTEIN  reports that she has never smoked. She has never used smokeless tobacco. She reports current alcohol use of about 2.0 standard drinks of alcohol per week. She reports that she does not use drugs.   family history includes Arrhythmia in her father; Heart attack in her maternal uncle; Heart disease in her father and maternal uncle; Hyperlipidemia in her brother; Hypertension in her paternal aunt; Pancreatic cancer in her paternal aunt.       Allergies  Allergen Reactions   Zithromax [Azithromycin] Nausea Only          PHYSICAL EXAMINATION: Vital signs: BP 128/78   Pulse 90   Ht '5\' 8"'$   (1.727 m)   Wt 145 lb (65.8 kg)   SpO2 98%   BMI 22.05 kg/m   Constitutional: generally well-appearing, no acute distress Psychiatric: alert and oriented x3, cooperative Eyes: extraocular movements intact, anicteric, conjunctiva pink Mouth: oral pharynx moist, no lesions Neck: supple no lymphadenopathy Cardiovascular: heart regular rate and rhythm, no murmur Lungs: clear to auscultation bilaterally Abdomen: soft, nontender, nondistended, no obvious ascites, no peritoneal signs, normal bowel sounds, no organomegaly Rectal: Deferred until colonoscopy Extremities: no clubbing, cyanosis, or lower extremity edema bilaterally Skin: no lesions on visible extremities Neuro: No focal deficits.  Cranial nerves intact   ASSESSMENT:   1.  Personal history of advanced adenomatous colon polyp September 2020. 2.  General medical problems.  Stable     PLAN:   1.  Surveillance colonoscopy.  Appropriate candidate without contraindication.The nature of the procedure, as well as the risks, benefits, and alternatives were carefully and thoroughly reviewed with the patient. Ample time for discussion and questions allowed. The patient understood, was satisfied, and agreed to proceed.

## 2022-06-04 NOTE — Progress Notes (Signed)
Report to PACU, RN, vss, BBS= Clear.  

## 2022-06-04 NOTE — Patient Instructions (Signed)
Information on diverticulosis given to you today.  Resume previous diet and medications.  Repeat colonoscopy in 5 years for surveillance.    YOU HAD AN ENDOSCOPIC PROCEDURE TODAY AT [facility]:   Refer to the procedure report that was given to you for any specific questions about what was found during the examination.  If the procedure report does not answer your questions, please call your gastroenterologist to clarify.  If you requested that your care partner not be given the details of your procedure findings, then the procedure report has been included in a sealed envelope for you to review at your convenience later.  YOU SHOULD EXPECT: Some feelings of bloating in the abdomen. Passage of more gas than usual.  Walking can help get rid of the air that was put into your GI tract during the procedure and reduce the bloating. If you had a lower endoscopy (such as a colonoscopy or flexible sigmoidoscopy) you may notice spotting of blood in your stool or on the toilet paper. If you underwent a bowel prep for your procedure, you may not have a normal bowel movement for a few days.  Please Note:  You might notice some irritation and congestion in your nose or some drainage.  This is from the oxygen used during your procedure.  There is no need for concern and it should clear up in a day or so.  SYMPTOMS TO REPORT IMMEDIATELY:  Following lower endoscopy (colonoscopy or flexible sigmoidoscopy):  Excessive amounts of blood in the stool  Significant tenderness or worsening of abdominal pains  Swelling of the abdomen that is new, acute  Fever of 100F or higher   For urgent or emergent issues, a gastroenterologist can be reached at any hour by calling [phone]. Do not use MyChart messaging for urgent concerns.    DIET:  We do recommend a small meal at first, but then you may proceed to your regular diet.  Drink plenty of fluids but you should avoid alcoholic beverages for 24  hours.  ACTIVITY:  You should plan to take it easy for the rest of today and you should NOT DRIVE or use heavy machinery until tomorrow (because of the sedation medicines used during the test).    FOLLOW UP: Our staff will call the number listed on your records the next business day following your procedure.  We will call around 7:15- 8:00 am to check on you and address any questions or concerns that you may have regarding the information given to you following your procedure. If we do not reach you, we will leave a message.     If any biopsies were taken you will be contacted by phone or by letter within the next 1-3 weeks.  Please call us at [phone] if you have not heard about the biopsies in 3 weeks.    SIGNATURES/CONFIDENTIALITY: You and/or your care partner have signed paperwork which will be entered into your electronic medical record.  These signatures attest to the fact that that the information above on your After Visit Summary has been reviewed and is understood.  Full responsibility of the confidentiality of this discharge information lies with you and/or your care-partner.

## 2022-06-04 NOTE — Op Note (Signed)
[facility] Patient Name: [name] Procedure Date: 06/04/2022 11:24 AM MRN: [id number] Endoscopist: Docia Chuck. Henrene Pastor , MD, [id number] Age: 52 Referring MD:  Date of Birth: 1970-09-27 Gender: Female Account #: [id number] Procedure:                Colonoscopy Indications:              High risk colon cancer surveillance: Personal                            history of adenoma (10 mm or greater in size).                            Previous examinations September 2020 (Dr. Earlean Shawl)                            with 3 cm tubular adenoma in the ascending colon.                            Resected/tattooed. Medicines:                Monitored Anesthesia Care Procedure:                Pre-Anesthesia Assessment:                           - Prior to the procedure, a History and Physical                            was performed, and patient medications and                            allergies were reviewed. The patient's tolerance of                            previous anesthesia was also reviewed. The risks                            and benefits of the procedure and the sedation                            options and risks were discussed with the patient.                            All questions were answered, and informed consent                            was obtained. Prior Anticoagulants: The patient has                            taken no anticoagulant or antiplatelet agents. ASA                            Grade Assessment: II - A patient with mild systemic  disease. After reviewing the risks and benefits,                            the patient was deemed in satisfactory condition to                            undergo the procedure.                           After obtaining informed consent, the colonoscope                            was passed under direct vision. Throughout the                            procedure, the patient's blood pressure, pulse,  and                            oxygen saturations were monitored continuously. The                            Olympus CF-HQ190L ([id number]) Colonoscope was                            introduced through the anus and advanced to the the                            cecum, identified by appendiceal orifice and                            ileocecal valve. The ileocecal valve, appendiceal                            orifice, and rectum were photographed. The quality                            of the bowel preparation was excellent. The                            colonoscopy was performed without difficulty. The                            patient tolerated the procedure well. The bowel                            preparation used was SUPREP via split dose                            instruction. Scope In: 11:40:43 AM Scope Out: 11:52:01 AM Scope Withdrawal Time: 0 hours 8 minutes 32 seconds  Total Procedure Duration: 0 hours 11 minutes 18 seconds  Findings:                 A few diverticula were found in the sigmoid colon  and ascending colon.                           The exam was otherwise without abnormality on                            direct and retroflexion views. The previous                            resection site scar and marking tattoo easily                            identified. No residual polyp. Complications:            No immediate complications. Estimated blood loss:                            None. Estimated Blood Loss:     Estimated blood loss: none. Impression:               - Diverticulosis in the sigmoid colon and in the                            ascending colon.                           - The examination was otherwise normal on direct                            and retroflexion views.                           - No specimens collected. Recommendation:           - Repeat colonoscopy in 5 years for surveillance.                           - Patient  has a contact number available for                            emergencies. The signs and symptoms of potential                            delayed complications were discussed with the                            patient. Return to normal activities tomorrow.                            Written discharge instructions were provided to the                            patient.                           - Resume previous diet.                           -  Continue present medications. Docia Chuck. Henrene Pastor, MD 06/04/2022 12:01:15 PM This report has been signed electronically.

## 2022-06-04 NOTE — Progress Notes (Signed)
VS completed by DT.  Pt's states no medical or surgical changes since previsit or office visit.  

## 2022-06-07 ENCOUNTER — Telehealth: Payer: Self-pay

## 2022-06-07 NOTE — Telephone Encounter (Signed)
  Follow up Call-     06/04/2022   10:44 AM  Call back number  Post procedure Call Back phone  # [phone]  Permission to leave phone message Yes     Patient questions:  Do you have a fever, pain , or abdominal swelling? No. Pain Score  0 *  Have you tolerated food without any problems? Yes.    Have you been able to return to your normal activities? Yes.    Do you have any questions about your discharge instructions: Diet   No. Medications  No. Follow up visit  No.  Do you have questions or concerns about your Care? No.  Actions: * If pain score is 4 or above: No action needed, pain <4.

## 2022-10-02 ENCOUNTER — Other Ambulatory Visit: Payer: Self-pay | Admitting: Cardiology

## 2022-10-30 ENCOUNTER — Other Ambulatory Visit: Payer: Self-pay | Admitting: Cardiology

## 2022-11-03 ENCOUNTER — Telehealth: Payer: Self-pay | Admitting: Cardiology

## 2022-11-03 MED ORDER — LOSARTAN POTASSIUM 50 MG PO TABS: 50.0000 mg | ORAL_TABLET | Freq: Every day | ORAL | 0 refills | Status: DC

## 2022-11-03 NOTE — Telephone Encounter (Signed)
Pt's medication was sent to pt's pharmacy as requested. Confirmation received.  °

## 2022-11-03 NOTE — Telephone Encounter (Signed)
*  STAT* If patient is at the pharmacy, call can be transferred to refill team.   1. Which medications need to be refilled? (please list name of each medication and dose if known)  losartan (COZAAR) 50 MG tablet   2. Which pharmacy/location (including street and city if local pharmacy) is medication to be sent to? [facility] [address], [place], [place] [address]  3. Do they need a 30 day or 90 day supply? 7 day supply  Patient is out of town and she forgot to take her medication with her.

## 2022-11-10 ENCOUNTER — Other Ambulatory Visit: Payer: Self-pay | Admitting: Cardiology

## 2022-12-30 ENCOUNTER — Telehealth: Payer: Self-pay | Admitting: Cardiology

## 2022-12-30 MED ORDER — LOSARTAN POTASSIUM 50 MG PO TABS: 50.0000 mg | ORAL_TABLET | Freq: Every day | ORAL | 0 refills | Status: DC

## 2022-12-30 NOTE — Telephone Encounter (Signed)
Pt's medication was sent to pt's pharmacy as requested. Confirmation received.  °

## 2022-12-30 NOTE — Telephone Encounter (Signed)
*  STAT* If patient is at the pharmacy, call can be transferred to refill team.   1. Which medications need to be refilled? (please list name of each medication and dose if known)   losartan (COZAAR) 50 MG tablet   2. Would you like to learn more about the convenience, safety, & potential cost savings by using the [facility]?   3. Are you open to using the [facility] (Type [facility]. ).  4. Which pharmacy/location (including street and city if local pharmacy) is medication to be sent to?  [id number] [place], [place] - [address]   5. Do they need a 30 day or 90 day supply?   90 day  Patient stated she still has some medication. Patient has appointment scheduled on 11/5.

## 2023-03-02 ENCOUNTER — Other Ambulatory Visit: Payer: Self-pay | Admitting: Cardiology

## 2023-03-03 NOTE — Progress Notes (Signed)
Cardiology Office Note   Date:  03/15/2023   ID:  [name], House [birth year], MRN [id number]  PCP:  Eartha Inch, MD  Cardiologist:   Migdalia Olejniczak [place], MD   Chief Complaint  Patient presents with   Hypertension      History of Present Illness: [name] is a 52 y.o. female who is seen for follow up of HTN. She has a history of elevated BP . Was seen by [facility] cardiology in May 2023 for some vague palpitations and elevated BP.  Had a prior stress Echo in 2021 there. She states in May she was under a lot of stress and BP was elevated up to 165/100. She obtained a home BP monitor and has been tracking her BP. Typically reads low 140s systolic and 90s diastolic. She generally feels well. Denies any palpitations currently. No chest pain or dyspnea. Does have a bad family history of CAD on her father's side. On her last visit we started her on losartan 50 mg daily.   On follow up today she is feeling very well. No new health concerns. Is tolerating medication well. Eating healthy and staying active.    Past Medical History:  Diagnosis Date   Asthma    Cancer (HCC) 2009   melanoma   Hypertension     Past Surgical History:  Procedure Laterality Date   ablation  2016   uterine   APPENDECTOMY     BREAST CYST ASPIRATION  07/09/2002   COLONOSCOPY     MELANOMA EXCISION  2009     Current Outpatient Medications  Medication Sig Dispense Refill   estradiol (ESTRACE) 2 MG tablet estradiol 2 mg tablet  TAKE 1 TABLET BY MOUTH EVERY DAY     progesterone (PROMETRIUM) 100 MG capsule progesterone micronized 100 mg capsule  TAKE 1 CAPSULE BY MOUTH EVERYDAY AT BEDTIME     valACYclovir (VALTREX) 1000 MG tablet as needed.     losartan (COZAAR) 50 MG tablet Take 1 tablet (50 mg total) by mouth daily. 90 tablet 3   Current Facility-Administered Medications  Medication Dose Route Frequency Provider Last Rate Last Admin   0.9 %  sodium chloride infusion  500 mL Intravenous Once  Hilarie Fredrickson, MD        Allergies:   Kiwi extract and Zithromax [azithromycin]    Social History:  The patient  reports that she has never smoked. She has never used smokeless tobacco. She reports current alcohol use of about 2.0 standard drinks of alcohol per week. She reports that she does not use drugs.   Family History:  The patient's family history includes Arrhythmia in her father; Heart attack in her maternal uncle; Heart disease in her father and maternal uncle; Hyperlipidemia in her brother; Hypertension in her paternal aunt; Pancreatic cancer in her paternal aunt.    ROS:  Please see the history of present illness.   Otherwise, review of systems are positive for none.   All other systems are reviewed and negative.    PHYSICAL EXAM: VS:  BP 117/83 (BP Location: Left Arm, Patient Position: Sitting, Cuff Size: Normal)   Pulse 92   Ht 5\' 7"  (1.702 m)   Wt 148 lb (67.1 kg)   SpO2 99%   BMI 23.18 kg/m  , BMI Body mass index is 23.18 kg/m. GEN: Well nourished, well developed, in no acute distress HEENT: normal Neck: no JVD, carotid bruits, or masses Cardiac: RRR; no murmurs, rubs, or gallops,no edema  Respiratory:  clear to auscultation bilaterally, normal work of breathing GI: soft, nontender, nondistended, + BS MS: no deformity or atrophy Skin: warm and dry, no rash Neuro:  Strength and sensation are intact Psych: euthymic mood, full affect   EKG:  EKG is not ordered today.    Recent Labs: No results found for requested labs within last 365 days.    Lipid Panel No results found for: "CHOL", "TRIG", "HDL", "CHOLHDL", "VLDL", "LDLCALC", "LDLDIRECT"   Labs dated 09/21/21: cholesterol 190, triglycerides 85, HDL 85, LDL 90. CBC, CMET, TSH normal. BNP 17.2   Wt Readings from Last 3 Encounters:  03/15/23 148 lb (67.1 kg)  06/04/22 145 lb (65.8 kg)  04/07/22 145 lb (65.8 kg)      Other studies Reviewed: None    ASSESSMENT AND PLAN:  1.  Essential HTN. Well  controlled on losartan. Will check BMET today. 2. Normal stress Echo 2021.   Follow up in one year   Disposition:   FU with me in 12 months  Signed, Jovanie Verge [place], MD  03/15/2023 1:48 PM    [facility] [address] [place], [place], [address] Phone [phone], Fax [phone]

## 2023-03-15 ENCOUNTER — Ambulatory Visit: Payer: BC Managed Care – PPO | Attending: Cardiology | Admitting: Cardiology

## 2023-03-15 ENCOUNTER — Encounter: Payer: Self-pay | Admitting: Cardiology

## 2023-03-15 VITALS — BP 117/83 | HR 92 | Ht 67.0 in | Wt 148.0 lb

## 2023-03-15 DIAGNOSIS — I10 Essential (primary) hypertension: Secondary | ICD-10-CM | POA: Diagnosis not present

## 2023-03-15 MED ORDER — LOSARTAN POTASSIUM 50 MG PO TABS: 50.0000 mg | ORAL_TABLET | Freq: Every day | ORAL | 3 refills | Status: DC

## 2023-03-15 NOTE — Patient Instructions (Signed)
Medication Instructions:  No changes  Continue taking medications  *If you need a refill on your cardiac medications before your next appointment, please call your pharmacy*   Lab Work: BMET  If you have labs (blood work) drawn today and your tests are completely normal, you will receive your results only by: MyChart Message (if you have MyChart) OR A paper copy in the mail If you have any lab test that is abnormal or we need to change your treatment, we will call you to review the results.   Testing/Procedures: None needed    Follow-Up: At [facility], you and your health needs are our priority.  As part of our continuing mission to provide you with exceptional heart care, we have created designated Provider Care Teams.  These Care Teams include your primary Cardiologist (physician) and Advanced Practice Providers (APPs -  Physician Assistants and Nurse Practitioners) who all work together to provide you with the care you need, when you need it.    Your next appointment:   1 year(s)  Provider:   Peter [place], MD

## 2023-03-16 LAB — BASIC METABOLIC PANEL
BUN/Creatinine Ratio: 22 (ref 9–23)
BUN: 13 mg/dL (ref 6–24)
CO2: 22 mmol/L (ref 20–29)
Calcium: 9.2 mg/dL (ref 8.7–10.2)
Chloride: 99 mmol/L (ref 96–106)
Creatinine, Ser: 0.58 mg/dL (ref 0.57–1.00)
Glucose: 110 mg/dL — ABNORMAL HIGH (ref 70–99)
Potassium: 4.8 mmol/L (ref 3.5–5.2)
Sodium: 138 mmol/L (ref 134–144)
eGFR: 107 mL/min/{1.73_m2} (ref >59)

## 2023-03-24 ENCOUNTER — Other Ambulatory Visit: Payer: Self-pay | Admitting: Family

## 2023-03-24 DIAGNOSIS — N6310 Unspecified lump in the right breast, unspecified quadrant: Secondary | ICD-10-CM

## 2023-03-30 ENCOUNTER — Ambulatory Visit
Admission: RE | Admit: 2023-03-30 | Discharge: 2023-03-30 | Disposition: A | Discharge status: Home / Self Care | Payer: BC Managed Care – PPO | Source: Ambulatory Visit | Attending: Family | Admitting: Family

## 2023-03-30 ENCOUNTER — Other Ambulatory Visit: Payer: BC Managed Care – PPO

## 2023-03-30 ENCOUNTER — Other Ambulatory Visit: Payer: Self-pay | Admitting: Family

## 2023-03-30 DIAGNOSIS — N6310 Unspecified lump in the right breast, unspecified quadrant: Secondary | ICD-10-CM

## 2023-03-30 DIAGNOSIS — N6320 Unspecified lump in the left breast, unspecified quadrant: Secondary | ICD-10-CM

## 2023-04-04 ENCOUNTER — Other Ambulatory Visit: Payer: BC Managed Care – PPO

## 2024-04-09 NOTE — Progress Notes (Unsigned)
 Cardiology Office Note   Date:  04/16/2024   ID:  Mckinna, Demars April 14, 1971, MRN [id number]  PCP:  Sophronia Ozell BROCKS, MD  Cardiologist:   Joelyn Lover, MD   Chief Complaint  Patient presents with   Hypertension      History of Present Illness: [name] is a 53 y.o. female who is seen for follow up of HTN. She has a history of elevated BP . Was seen by [facility] cardiology in May 2023 for some vague palpitations and elevated BP.  Had a prior stress Echo in 2021 there. She was started on losartan  with good BP control.   She was treated in July for bronchitis. CXR was normal.   On follow up today she is feeling very well. No new health concerns. Is tolerating medication well. Eating healthy and staying active.    Past Medical History:  Diagnosis Date   Asthma    Cancer (HCC) 2009   melanoma   Hypertension     Past Surgical History:  Procedure Laterality Date   ablation  2016   uterine   APPENDECTOMY     BREAST CYST ASPIRATION  07/09/2002   COLONOSCOPY     MELANOMA EXCISION  2009     Current Outpatient Medications  Medication Sig Dispense Refill   estradiol (ESTRACE) 2 MG tablet estradiol 2 mg tablet  TAKE 1 TABLET BY MOUTH EVERY DAY     progesterone (PROMETRIUM) 100 MG capsule progesterone micronized 100 mg capsule  TAKE 1 CAPSULE BY MOUTH EVERYDAY AT BEDTIME     valACYclovir (VALTREX) 1000 MG tablet as needed.     losartan  (COZAAR ) 50 MG tablet Take 1 tablet (50 mg total) by mouth daily. 90 tablet 3   Current Facility-Administered Medications  Medication Dose Route Frequency Provider Last Rate Last Admin   0.9 %  sodium chloride  infusion  500 mL Intravenous Once Abran Norleen SAILOR, MD        Allergies:   Kiwi extract and Zithromax [azithromycin]    Social History:  The patient  reports that she has never smoked. She has never used smokeless tobacco. She reports current alcohol use of about 2.0 standard drinks of alcohol per week. She reports that she  does not use drugs.   Family History:  The patient's family history includes Arrhythmia in her father; Heart attack in her maternal uncle; Heart disease in her father and maternal uncle; Hyperlipidemia in her brother; Hypertension in her paternal aunt; Pancreatic cancer in her paternal aunt.    ROS:  Please see the history of present illness.   Otherwise, review of systems are positive for none.   All other systems are reviewed and negative.    PHYSICAL EXAM: VS:  BP 122/84   Pulse 84   Ht 5' 8 (1.727 m)   Wt 155 lb 2 oz (70.4 kg)   BMI 23.59 kg/m  , BMI Body mass index is 23.59 kg/m. GEN: Well nourished, well developed, in no acute distress HEENT: normal Neck: no JVD, carotid bruits, or masses Cardiac: RRR; no murmurs, rubs, or gallops,no edema  Respiratory:  clear to auscultation bilaterally, normal work of breathing GI: soft, nontender, nondistended, + BS MS: no deformity or atrophy Skin: warm and dry, no rash Neuro:  Strength and sensation are intact Psych: euthymic mood, full affect   EKG Interpretation Date/Time:  Monday April 16 2024 09:14:29 EST Ventricular Rate:  84 PR Interval:  174 QRS Duration:  62 QT Interval:  356 QTC Calculation: 420 R Axis:   25  Text Interpretation: Normal sinus rhythm Low voltage QRS No previous ECGs available Confirmed by Dominic Rhome [phone]) on 04/16/2024 9:21:33 AM    Recent Labs: No results found for requested labs within last 365 days.    Lipid Panel No results found for: CHOL, TRIG, HDL, CHOLHDL, VLDL, LDLCALC, LDLDIRECT   Labs dated 09/21/21: cholesterol 190, triglycerides 85, HDL 85, LDL 90. CBC, CMET, TSH normal. BNP 17.2   Wt Readings from Last 3 Encounters:  04/16/24 155 lb 2 oz (70.4 kg)  03/15/23 148 lb (67.1 kg)  06/04/22 145 lb (65.8 kg)     Other studies Reviewed: None    ASSESSMENT AND PLAN:  1.  Essential HTN. Well controlled on losartan . Will check BMET today. 2. Normal stress Echo  2021.   Follow up in one year   Disposition:   FU with me in 12 months  Signed, Kyrielle Urbanski, MD  04/16/2024 9:28 AM    [facility] [address] [place], [place], [address] Phone [phone], Fax [phone]

## 2024-04-16 ENCOUNTER — Ambulatory Visit: Attending: Cardiology | Admitting: Cardiology

## 2024-04-16 ENCOUNTER — Encounter: Payer: Self-pay | Admitting: Cardiology

## 2024-04-16 VITALS — BP 122/84 | HR 84 | Ht 68.0 in | Wt 155.1 lb

## 2024-04-16 DIAGNOSIS — I10 Essential (primary) hypertension: Secondary | ICD-10-CM | POA: Diagnosis not present

## 2024-04-16 MED ORDER — LOSARTAN POTASSIUM 50 MG PO TABS: 50.0000 mg | ORAL_TABLET | Freq: Every day | ORAL | 3 refills | Status: AC

## 2024-04-16 NOTE — Patient Instructions (Signed)
 Medication Instructions:  Continue same medications *If you need a refill on your cardiac medications before your next appointment, please call your pharmacy*  Lab Work: None ordered  Testing/Procedures: None ordered  Follow-Up: At [facility], you and your health needs are our priority.  As part of our continuing mission to provide you with exceptional heart care, our providers are all part of one team.  This team includes your primary Cardiologist (physician) and Advanced Practice Providers or APPs (Physician Assistants and Nurse Practitioners) who all work together to provide you with the care you need, when you need it.  Your next appointment:  1 year    Call in Sept to schedule Dec appointment     Provider:  Dr.Jordan    We recommend signing up for the patient portal called MyChart.  Sign up information is provided on this After Visit Summary.  MyChart is used to connect with patients for Virtual Visits (Telemedicine).  Patients are able to view lab/test results, encounter notes, upcoming appointments, etc.  Non-urgent messages can be sent to your provider as well.   To learn more about what you can do with MyChart, go to [email or web address].

## 2024-05-07 ENCOUNTER — Other Ambulatory Visit: Payer: Self-pay | Admitting: Obstetrics and Gynecology

## 2024-05-07 DIAGNOSIS — R928 Other abnormal and inconclusive findings on diagnostic imaging of breast: Secondary | ICD-10-CM

## 2024-05-16 ENCOUNTER — Ambulatory Visit
Admission: RE | Admit: 2024-05-16 | Discharge: 2024-05-16 | Disposition: A | Discharge status: Home / Self Care | Source: Ambulatory Visit | Attending: Obstetrics and Gynecology | Admitting: Obstetrics and Gynecology

## 2024-05-16 ENCOUNTER — Other Ambulatory Visit: Payer: Self-pay | Admitting: Obstetrics and Gynecology

## 2024-05-16 DIAGNOSIS — R928 Other abnormal and inconclusive findings on diagnostic imaging of breast: Secondary | ICD-10-CM

## 2024-05-24 ENCOUNTER — Other Ambulatory Visit: Payer: Self-pay | Admitting: Obstetrics and Gynecology

## 2024-05-24 ENCOUNTER — Ambulatory Visit
Admission: RE | Admit: 2024-05-24 | Discharge: 2024-05-24 | Disposition: A | Source: Ambulatory Visit | Attending: Obstetrics and Gynecology | Admitting: Obstetrics and Gynecology

## 2024-05-24 DIAGNOSIS — R928 Other abnormal and inconclusive findings on diagnostic imaging of breast: Secondary | ICD-10-CM

## 2024-11-22 ENCOUNTER — Encounter
